# Patient Record
Sex: Female | Born: 1995 | Race: White | Hispanic: No | Marital: Single | Smoking: Never smoker
Health system: Southern US, Community
[De-identification: ages and names within clinical notes are randomized; demographics above are authoritative.]

## PROBLEM LIST (undated history)

## (undated) DIAGNOSIS — E669 Obesity, unspecified: Secondary | ICD-10-CM

## (undated) DIAGNOSIS — F419 Anxiety disorder, unspecified: Secondary | ICD-10-CM

## (undated) DIAGNOSIS — H539 Unspecified visual disturbance: Secondary | ICD-10-CM

## (undated) DIAGNOSIS — R011 Cardiac murmur, unspecified: Secondary | ICD-10-CM

## (undated) DIAGNOSIS — N915 Oligomenorrhea, unspecified: Secondary | ICD-10-CM

## (undated) HISTORY — PX: OTHER SURGICAL HISTORY: SHX169

## (undated) HISTORY — DX: Oligomenorrhea, unspecified: N91.5

---
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
## 2017-02-11 ENCOUNTER — Ambulatory Visit (INDEPENDENT_AMBULATORY_CARE_PROVIDER_SITE_OTHER): Payer: PRIVATE HEALTH INSURANCE | Admitting: Pediatrics

## 2017-03-11 ENCOUNTER — Ambulatory Visit (INDEPENDENT_AMBULATORY_CARE_PROVIDER_SITE_OTHER): Payer: PRIVATE HEALTH INSURANCE | Admitting: Pediatrics

## 2017-03-11 ENCOUNTER — Encounter (INDEPENDENT_AMBULATORY_CARE_PROVIDER_SITE_OTHER): Payer: Self-pay | Admitting: Pediatrics

## 2017-03-11 VITALS — BP 128/76 | HR 68 | Wt 178.2 lb

## 2017-03-11 DIAGNOSIS — E288 Other ovarian dysfunction: Secondary | ICD-10-CM

## 2017-03-11 DIAGNOSIS — N915 Oligomenorrhea, unspecified: Secondary | ICD-10-CM

## 2017-03-11 DIAGNOSIS — L659 Nonscarring hair loss, unspecified: Secondary | ICD-10-CM

## 2017-03-11 DIAGNOSIS — R635 Abnormal weight gain: Secondary | ICD-10-CM

## 2017-03-11 LAB — POCT GLUCOSE (DEVICE FOR HOME USE): POC GLUCOSE: 102 mg/dL — AB (ref 70–99)

## 2017-03-11 LAB — POCT GLYCOSYLATED HEMOGLOBIN (HGB A1C): Hemoglobin A1C: 5

## 2017-03-11 NOTE — Patient Instructions (Addendum)
It was a pleasure to see you in clinic today.   Feel free to contact our office at [phone] with questions or concerns.  -Continue your current medications  -Please let me know if you have concerns

## 2017-03-11 NOTE — Progress Notes (Signed)
Pediatric Endocrinology Consultation Follow-up Visit  [name], [name] [name] 10-14-1995  [name] [name], Leonard, MD  Chief Complaint: oligomenorrhea, thinning hair, hyperandrogenism  HPI: [name] [name]  is a 21 y.o. female presenting for follow-up of oligomenorrhea, hyperandrogenism, and thinning hair.  she a ttended this visit alone.  1. [name] was initially referred [facility] in 09/2015 for oligomenorrhea and thinning hair noted when she was evaluated by Dr. Sharene SkeansHickling for papilledema (neuro work-up of papilledema showed normal MRI/MRV with LP showing normal opening pressure and no abnormalities of CSF.  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  At her initial [facility] visit, she was noted to have thinning hair on her scalp and oligomenorrhea, so PCOS work-up was performed which showed elevated free testosterone, low SHBG, normal TFTs, normal prolactin.  She was started on an OCP (junel  Fe 1.5/30) to reduce androgen levels and increase SHBG to help regulate menses.  2. Since last visit on 09/24/16, [name] has been well.  She started college and is living on campus.    She continues to take junel OCP well without problems. No spotting.  She continues having bleeding only during the week of inactive pills.  She still gets acne on her chin, usually during the week of inactive pills or the first week of the new pack.  No hirsuitism.  She is self-conscious that the hair on her head appears very thin though she feels overall it has continued to thicken.    [name] gained 5lb since last visit.  She is eating more frequently at school.  She drinks coffee, water, and gatorade.  Activity includes walking on campus only; no organized exercise.  Her A1c has improved slightly to 50% today.  2. ROS: Greater than 10 systems reviewed with pertinent positives listed in HPI, otherwise neg.  Constitutional: weight as above.  No headaches, no changes in vision.  No neurology  follow-up required.  Genitourinary: Periods as above Psychiatric: Normal affect    Past Medical History:  Past Medical History:  Diagnosis Date  . Anxiety   . Heart murmur    as a baby,   . Obesity   . Oligomenorrhea    In 09/2015 had elevated free testosterone and low SHBG.  Started on Junel 1.5/30 OCPs.  . Vision abnormalities    wears contacts   Pregnancy complicated by diet controlled gestational diabetes and cholelithiasis, delivered at term, birth weight 6lb 7oz, birth length 19 inches, no NICU required  Meds: Junel Fe 1.5/30  Allergies: No Known Allergies  Surgical History: Past Surgical History:  Procedure Laterality Date  . None      Family History:  Family History  Problem Relation Age of Onset  . COPD Maternal Grandfather   . Asthma Maternal Grandfather   . Diabetes Maternal Grandfather   . Hypertension Maternal Grandfather   . Diabetes Maternal Grandmother   . Hypertension Maternal Grandmother   . Heart disease Other        valve issuses  . Hepatitis C Father   . Diabetes Maternal Aunt   No family hx of PCOS diagnosis though maternal aunt has thinning hair, facial hair, and diabetes with menstrual irregularities No family history of blood clot or stroke at an early age  Social History: [place] [name] [place]. College not too stressful this semester.  She is not working while attending college. She does not smoke.  Physical Exam:  Vitals:   03/11/17  0929  BP: 128/76  Pulse: 68  Weight: 178 lb 3.2 oz (80.8 kg)   BP 128/76   Pulse 68   Wt 178 lb 3.2 oz (80.8 kg)   BMI 32.30 kg/m  Body mass index: body mass index is 32.3 kg/m. No height on file for this encounter.   Wt Readings from Last 3 Encounters:  03/11/17 178 lb 3.2 oz (80.8 kg) (95 %, Z= 1.61)*  09/24/16 173 lb 9.6 oz (78.7 kg) (94 %, Z= 1.55)*  05/26/16 180 lb 9.6 oz (81.9 kg) (95 %, Z= 1.69)*   * Growth percentiles are based on CDC 2-20 Years data.   Ht  Readings from Last 3 Encounters:  09/24/16 5' 2.28" (1.582 m) (22 %, Z= -0.77)*  01/24/16 5' 2.32" (1.583 m) (23 %, Z= -0.74)*  10/18/15 5' 2.68" (1.592 m) (28 %, Z= -0.59)*   * Growth percentiles are based on CDC 2-20 Years data.   Body mass index is 32.3 kg/m.  95 %ile (Z= 1.61) based on CDC 2-20 Years weight-for-age data using vitals from 03/11/2017. No height on file for this encounter.  General: Well developed, female in no acute distress.  Appears stated age, answers questions appropriately Head: Normocephalic, atraumatic.  Hair appears full on posterior/lateral scalp though small amount of scalp visible on right just above anterior hairline Eyes:  Pupils equal and round. EOMI.   Sclera white.  No eye drainage.   Ears/Nose/Mouth/Throat: Nares patent, no nasal drainage.  Normal dentition, mucous membranes moist.  Oropharynx intact.  No significant hair growth on face or abdomen.  Few erythematous papules on chin Neck: supple, no cervical lymphadenopathy, no thyromegaly. No acanthosis nigricans.  Cardiovascular: regular rate, normal S1/S2, no murmurs Respiratory: No increased work of breathing.  Lungs clear to auscultation bilaterally.  No wheezes. Abdomen: soft, nontender, nondistended. No appreciable masses.  No dark hairs on abdomen.  Extremities: warm, well perfused, cap refill < 2 sec.   Musculoskeletal: Normal muscle mass.  Normal strength Skin: warm, dry.  No rash. Acne as above Neurologic: alert and oriented, normal speech, quieter than in the past   Laboratory Evaluation: Results for orders placed or performed in visit on 03/11/17  POCT Glucose (Device for Home Use)  Result Value Ref Range   Glucose Fasting, POC  70 - 99 mg/dL   POC Glucose 409 (A) 70 - 99 mg/dl  POCT HgB W1X  Result Value Ref Range   Hemoglobin A1C 5.0    A1c trend: 5.5% in 09/2015-->5.4% 04/2016-->5.1% 08/2016-->5.0% 02/2017  Assessment/Plan: [name] is a 21 y.o. female with history of  oligomenorrhea, thinning hair, and hyperandrogenism who has had improvement in menstrual regularity and clinical improvement in hyperandrogenism since starting OCPs.  She did have weight gain since last visit, likely due to life changes (starting college, changing living situation).  A1c has improved and remains in the normal range.    1. Oligomenorrhea, unspecified type -Continue current OCPs -Advised to contact me if she develops spotting or other menstruation concerns -Advised not to smoke  2. Thinning hair -I expect continued improvement as androgen levels improve from OCP treatment  3. Hyperandrogenism -Clinically improving.  Will continue to monitor at future visits  4. Weight gain -A1c and POC glucose as above -Encouraged increased activity as able -Change to G2 instead of regular gatorade -Will continue to monitor closely at future visits  Follow-up:   Return in about 5 months (around 08/11/2017). 4-6 month follow-up   Casimiro Needle, MD

## 2017-08-12 ENCOUNTER — Ambulatory Visit (INDEPENDENT_AMBULATORY_CARE_PROVIDER_SITE_OTHER): Payer: PRIVATE HEALTH INSURANCE | Admitting: Pediatrics

## 2017-08-23 IMAGING — MR MR [PERSON_NAME] HEAD
11 series · 46 of 48 positions shown · non-contrast
Comparison: None.

CLINICAL DATA: Papilledema. Obesity. Possible idiopathic
intracranial hypertension or venous sinus thrombosis.

EXAM:
MRI HEAD WITHOUT CONTRAST
MRV HEAD WITHOUT CONTRAST
TECHNIQUE: Multiplanar, multiecho pulse sequences of the brain and surrounding
structures were obtained without intravenous contrast. Angiographic
images of the intracranial venous structures were obtained using MRV
technique without intravenous contrast.

[Series 2: T1 · sagittal · 5.0mm · 0.45mm/px · 1 of 21 slices shown]
[im 1/21]
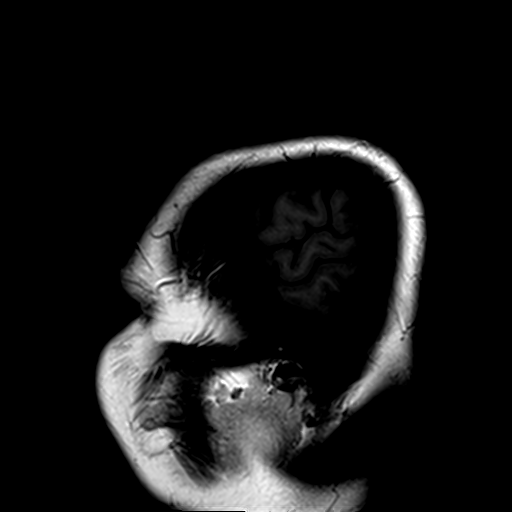

[Series 3: DWI · axial · 3.0mm · 1.80mm/px · z∈[-42,+104]mm · 8 of 99 slices shown (1 of 4)]
[im 1/99]
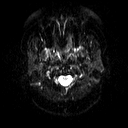
[im 15/99]
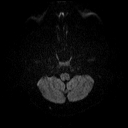
[im 29/99]
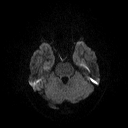
[im 43/99]
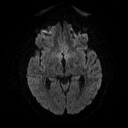
[im 57/99]
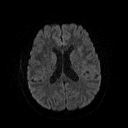
[im 71/99]
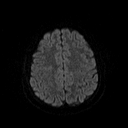
[im 85/99]
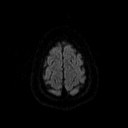
[im 99/99]
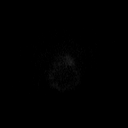

[Series 4: DWI · axial · 3.0mm · 1.80mm/px · z∈[-42,+104]mm · 4 of 49 slices shown (2 of 4)]
[im 1/49]
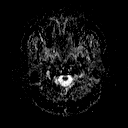
[im 17/49]
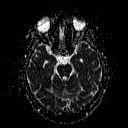
[im 33/49]
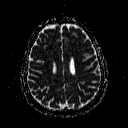
[im 49/49]
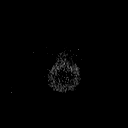

[Series 6: swi_images · axial · 2.0mm · 0.90mm/px · z∈[-48,+110]mm · 6 of 80 slices shown]
[im 1/80]
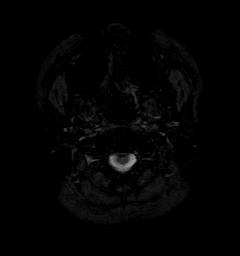
[im 16/80]
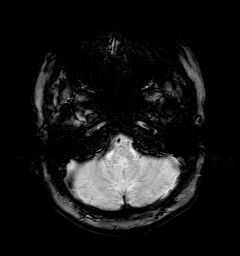
[im 32/80]
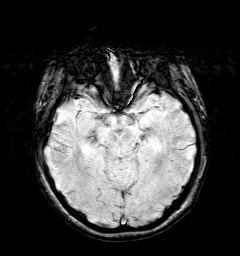
[im 48/80]
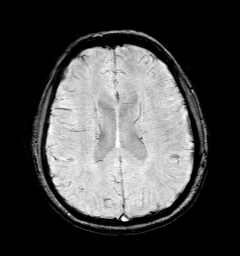
[im 64/80]
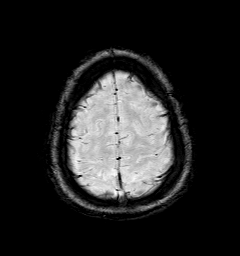
[im 80/80]
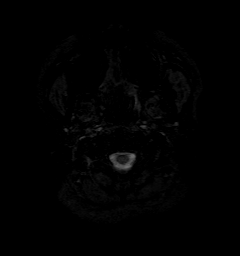

[Series 7: DWI · coronal · 5.0mm · 1.80mm/px · 6 of 73 slices shown (3 of 4)]
[im 1/73]
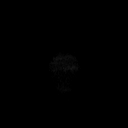
[im 15/73]
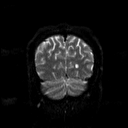
[im 29/73]
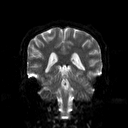
[im 44/73]
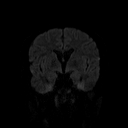
[im 58/73]
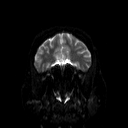
[im 73/73]
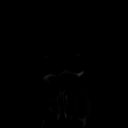

[Series 8: DWI · coronal · 5.0mm · 1.80mm/px · 3 of 38 slices shown (4 of 4)]
[im 1/38]
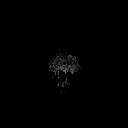
[im 19/38]
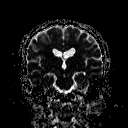
[im 38/38]
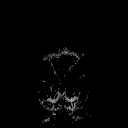

[Series 9: T2 · axial · 5.0mm · 0.51mm/px · z∈[-38,+103]mm · 2 of 22 slices shown (1 of 2)]
[im 1/22]
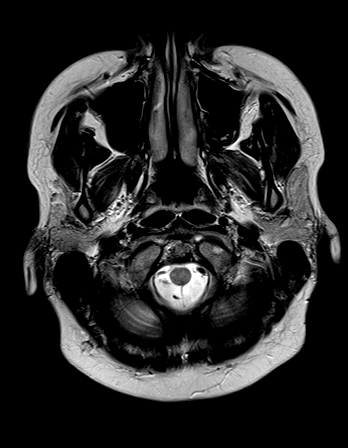
[im 22/22]
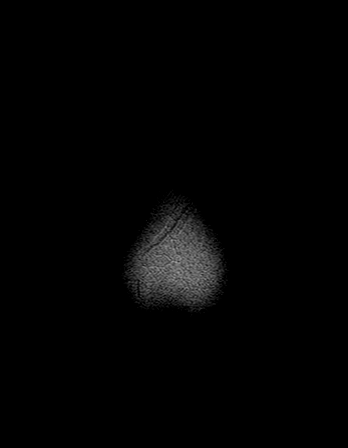

[Series 10: FLAIR · axial · 5.0mm · 0.45mm/px · z∈[-38,+103]mm · 2 of 22 slices shown]
[im 1/22]
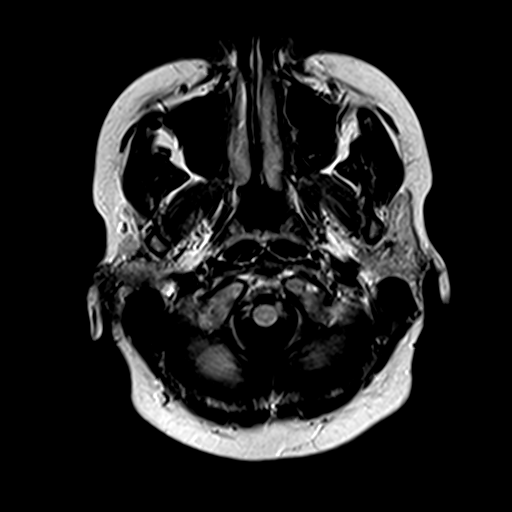
[im 22/22]
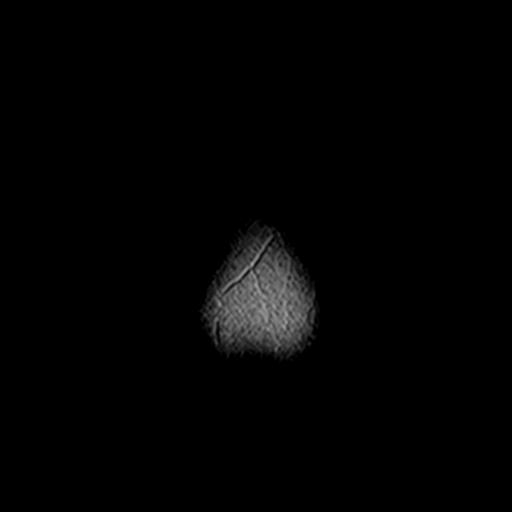

[Series 11: t1_mpr_tra · axial · 2.0mm · 0.45mm/px · z∈[-46,+111]mm · 6 of 80 slices shown]
[im 1/80]
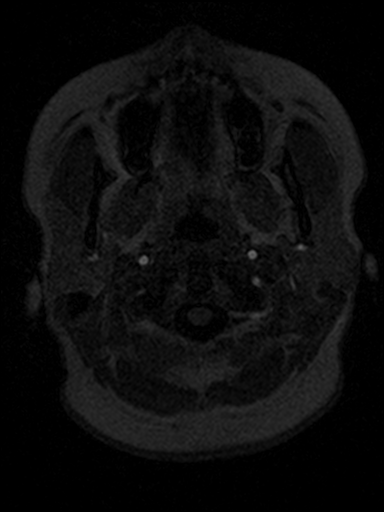
[im 16/80]
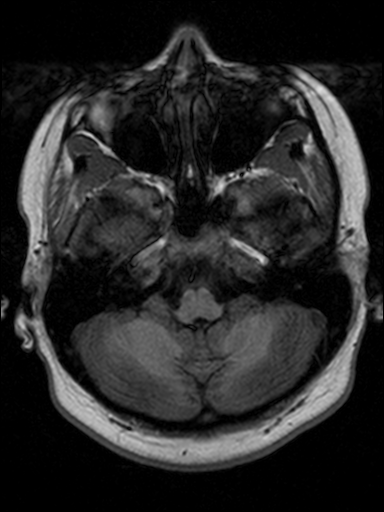
[im 32/80]
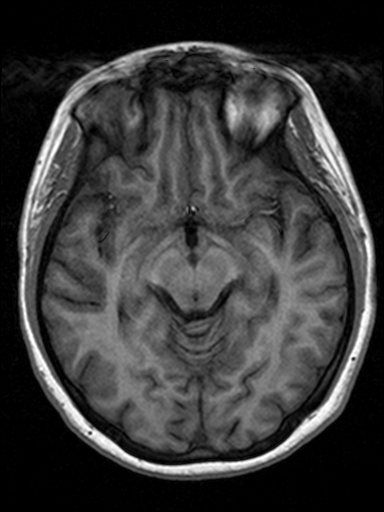
[im 48/80]
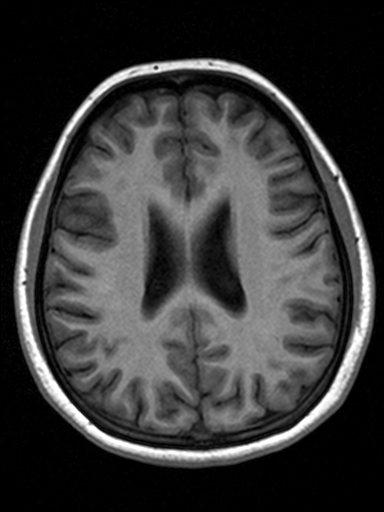
[im 64/80]
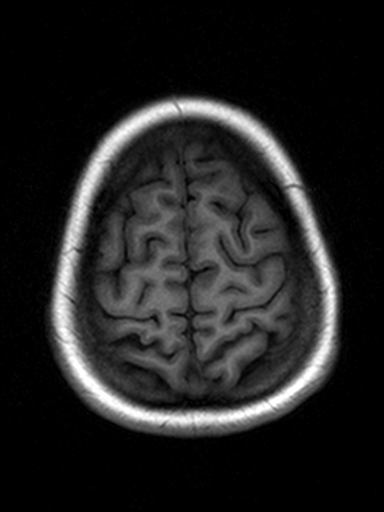
[im 80/80]
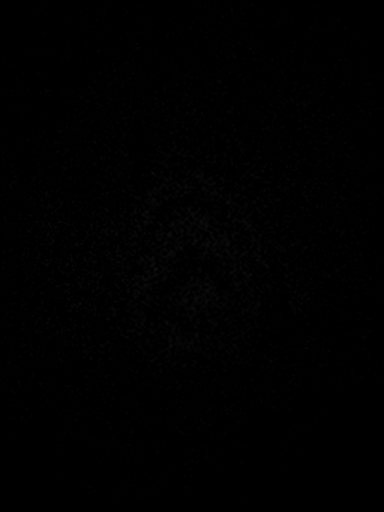

[Series 12: T2 · coronal · 5.0mm · 0.45mm/px · 2 of 28 slices shown (2 of 2)]
[im 1/28]
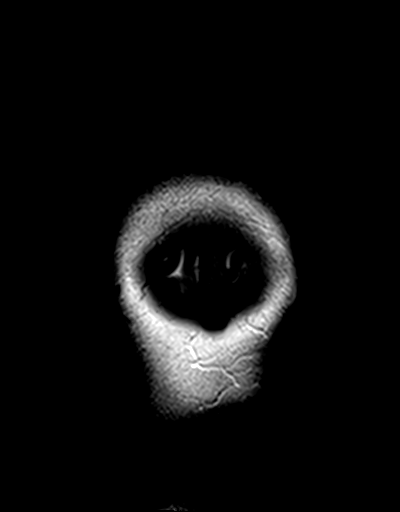
[im 28/28]
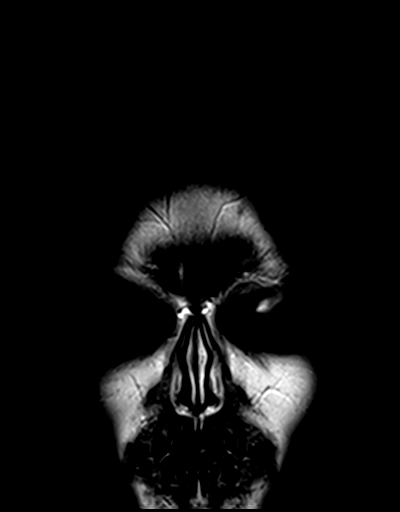

[Series 13: (id) coronal · coronal · 3.0mm · 0.49mm/px · 6 of 98 slices shown]
[im 1/98]
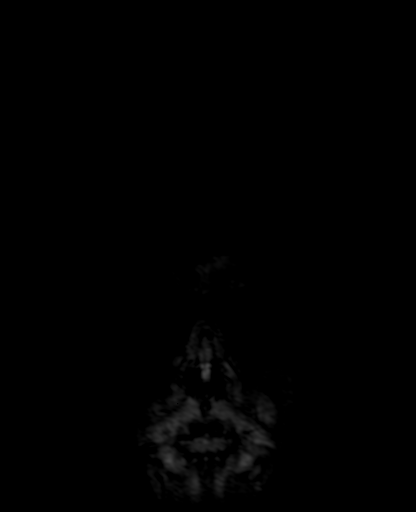
[im 14/98]
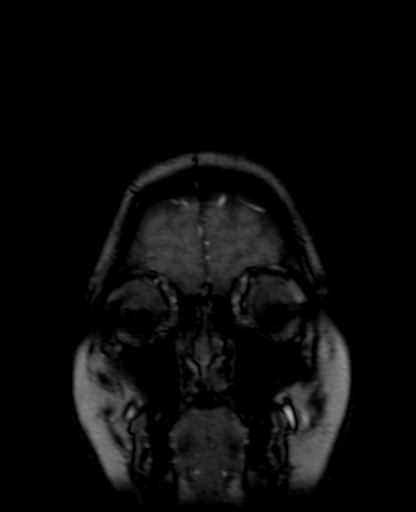
[im 28/98]
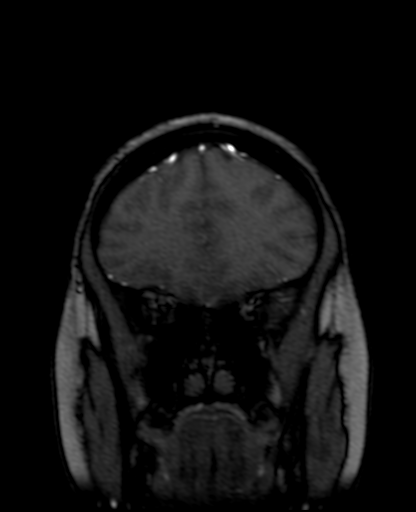
[im 42/98]
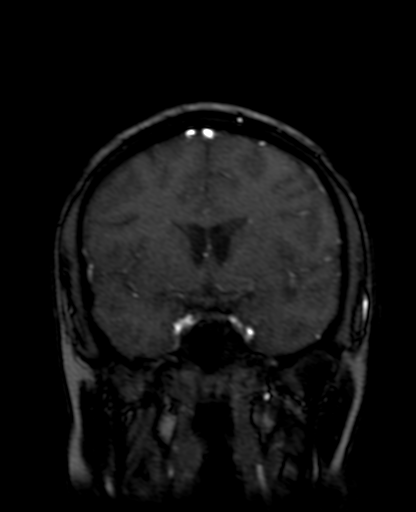
[im 56/98]
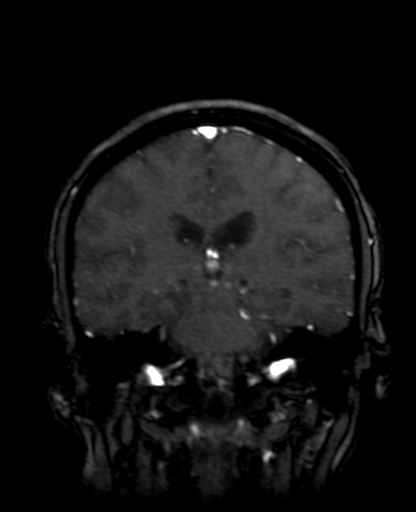
[im 70/98]
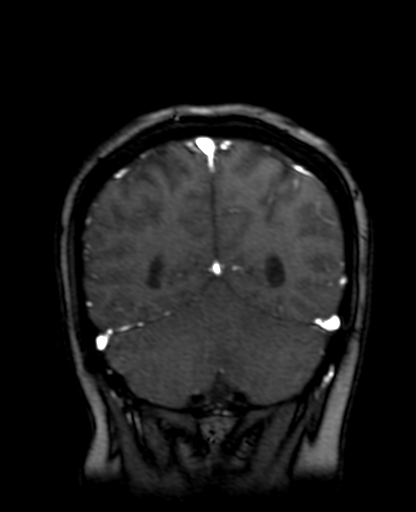

[46 of 48 positions shown; findings below may reference images not displayed]

FINDINGS: There is no evidence of acute infarct, intracranial hemorrhage,
mass, midline shift, or extra-axial fluid collection. Ventricles and
sulci are normal. The brain is normal in signal.

Orbits are grossly unremarkable on this nondedicated study. No
significant inflammatory changes are seen in the paranasal sinuses
or mastoid air cells. Major intracranial vascular flow voids are
preserved.

Superior sagittal sinus, internal cerebral veins, vein of Kwiatkowski,
straight sinus, transverse sinuses, sigmoid sinuses, and jugular
bulbs are patent. There is a 1 cm focus of decreased signal in the
medial right transverse sinus compatible with stenosis. The left
transverse sinus is widely patent without stenosis.
IMPRESSION: 1. Unremarkable brain MRI.
2. No evidence of venous sinus thrombosis.
3. Focal stenosis in the medial right transverse sinus.

## 2017-11-10 ENCOUNTER — Other Ambulatory Visit (INDEPENDENT_AMBULATORY_CARE_PROVIDER_SITE_OTHER): Payer: Self-pay | Admitting: Pediatrics

## 2017-11-10 DIAGNOSIS — N915 Oligomenorrhea, unspecified: Secondary | ICD-10-CM

## 2017-12-09 ENCOUNTER — Ambulatory Visit (INDEPENDENT_AMBULATORY_CARE_PROVIDER_SITE_OTHER): Payer: PRIVATE HEALTH INSURANCE | Admitting: Pediatrics

## 2017-12-09 ENCOUNTER — Encounter (INDEPENDENT_AMBULATORY_CARE_PROVIDER_SITE_OTHER): Payer: Self-pay | Admitting: Pediatrics

## 2017-12-09 VITALS — BP 116/60 | HR 68 | Ht 62.01 in | Wt 179.8 lb

## 2017-12-09 DIAGNOSIS — N915 Oligomenorrhea, unspecified: Secondary | ICD-10-CM | POA: Diagnosis not present

## 2017-12-09 DIAGNOSIS — L659 Nonscarring hair loss, unspecified: Secondary | ICD-10-CM | POA: Diagnosis not present

## 2017-12-09 DIAGNOSIS — E288 Other ovarian dysfunction: Secondary | ICD-10-CM | POA: Diagnosis not present

## 2017-12-09 LAB — POCT GLUCOSE (DEVICE FOR HOME USE): POC Glucose: 77 mg/dl (ref 70–99)

## 2017-12-09 LAB — POCT GLYCOSYLATED HEMOGLOBIN (HGB A1C): HEMOGLOBIN A1C: 4.8 % (ref 4.0–5.6)

## 2017-12-09 NOTE — Patient Instructions (Addendum)
It was a pleasure to see you in clinic today.   Feel free to contact our office at [phone] with questions or concerns.  Don't smoke!  If you have swelling or pain in 1 leg without other symptoms, please be evaluated for a blood clot

## 2017-12-10 DIAGNOSIS — N915 Oligomenorrhea, unspecified: Secondary | ICD-10-CM | POA: Insufficient documentation

## 2017-12-10 DIAGNOSIS — E288 Other ovarian dysfunction: Secondary | ICD-10-CM | POA: Insufficient documentation

## 2017-12-10 NOTE — Progress Notes (Signed)
Pediatric Endocrinology Consultation Follow-up Visit  [name], [name] [name] 12/30/1995  [name], MD  Chief Complaint: oligomenorrhea, thinning hair, hyperandrogenism  HPI: [name]  is a 22 y.o. female presenting for follow-up of oligomenorrhea, hyperandrogenism, and thinning hair.  she attended this visit alone.  1. [place] was initially referred to [name] in 09/2015 for oligomenorrhea and thinning hair noted when she was evaluated by Dr. Sharene Skeans for papilledema (neuro work-up of papilledema showed normal MRI/MRV with LP showing normal opening pressure and no abnormalities of CSF.  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  At her initial [name] visit, she was noted to have thinning hair on her scalp and oligomenorrhea, so PCOS work-up was performed which showed elevated free testosterone, low SHBG, normal TFTs, normal prolactin.  She was started on an OCP (junel  Fe 1.5/30) to reduce androgen levels and increase SHBG to help regulate menses.  2. Since last visit on 03/11/17, [name] has been well.  No recent problems.   She continues on Junel 1.5/30.  Having withdrawal bleed as expected during the week of inactive pills.  Periods lasting 4-5 days.  No significant cramps. No spotting.  No acne.  No hirsutism.  Hair on her head is getting slightly thicker; she has been taking biotin to help with this.   Her A1c is normal today at 4.8%.  Weight is up 1lb since last visit.   ROS: Greater than 10 systems reviewed with pertinent positives listed in HPI, otherwise neg.  Constitutional: weight as above.  No blurry vision. Genitourinary: Periods as above Psychiatric: Normal affect   Past Medical History:  Past Medical History:  Diagnosis Date  . Anxiety   . Heart murmur    as a baby,   . Obesity   . Oligomenorrhea    In 09/2015 had elevated free testosterone and low SHBG.  Started on Junel 1.5/30 OCPs.  . Vision abnormalities    wears contacts   Pregnancy complicated by diet controlled gestational diabetes and cholelithiasis, delivered at term, birth weight 6lb 7oz, birth length 19 inches, no NICU required  Meds: Junel Fe 1.5/30 Biotin  Allergies: No Known Allergies  Surgical History: Past Surgical History:  Procedure Laterality Date  . None      Family History:  Family History  Problem Relation Age of Onset  . COPD Maternal Grandfather   . Asthma Maternal Grandfather   . Diabetes Maternal Grandfather   . Hypertension Maternal Grandfather   . Diabetes Maternal Grandmother   . Hypertension Maternal Grandmother   . Heart disease Other        valve issuses  . Hepatitis C Father   . Diabetes Maternal Aunt   No family hx of PCOS diagnosis though maternal aunt has thinning hair, facial hair, and diabetes with menstrual irregularities No family history of blood clot or stroke at an early age  Social History: Does not smoke. Completed freshman year at [name] and is taking summer classes.  Living at home for the summer.  Working at [employer].  Physical Exam:  Vitals:   12/09/17 1357  BP: 116/60  Pulse: 68  Weight: 179 lb 12.8 oz (81.6 kg)  Height: 5' 2.01" (1.575 m)   BP 116/60   Pulse 68   Ht 5' 2.01" (1.575 m)   Wt 179 lb 12.8 oz (81.6 kg)   LMP 11/15/2017 (Within Weeks)   BMI 32.88 kg/m  Body mass index: body mass index is 32.88  kg/m. Blood pressure percentiles are not available for patients who are 18 years or older.   Wt Readings from Last 3 Encounters:  12/09/17 179 lb 12.8 oz (81.6 kg) (95 %, Z= 1.60)*  03/11/17 178 lb 3.2 oz (80.8 kg) (95 %, Z= 1.61)*  09/24/16 173 lb 9.6 oz (78.7 kg) (94 %, Z= 1.55)*   * Growth percentiles are based on CDC (Girls, 2-20 Years) data.   Ht Readings from Last 3 Encounters:  12/09/17 5' 2.01" (1.575 m) (19 %, Z= -0.89)*  09/24/16 5' 2.28" (1.582 m) (22 %, Z= -0.77)*  01/24/16 5' 2.32" (1.583 m) (23 %, Z= -0.74)*   * Growth percentiles are based on  CDC (Girls, 2-20 Years) data.   Body mass index is 32.88 kg/m.  95 %ile (Z= 1.60) based on CDC (Girls, 2-20 Years) weight-for-age data using vitals from 12/09/2017. 19 %ile (Z= -0.89) based on CDC (Girls, 2-20 Years) Stature-for-age data based on Stature recorded on 12/09/2017.  General: Well developed, well nourished female in no acute distress.  Appears stated age Head: Normocephalic, atraumatic.  Hair thin with scalp barely visible Eyes:  Pupils equal and round. EOMI.   Sclera white.  No eye drainage.   Ears/Nose/Mouth/Throat: Nares patent, no nasal drainage.  Normal dentition, mucous membranes moist.   Neck: supple, no cervical lymphadenopathy, no thyromegaly Cardiovascular: regular rate, normal S1/S2, no murmurs Respiratory: No increased work of breathing.  Lungs clear to auscultation bilaterally.  No wheezes. Abdomen: soft, nontender, nondistended. No appreciable masses  Extremities: warm, well perfused, cap refill < 2 sec.   Musculoskeletal: Normal muscle mass.  Normal strength Skin: warm, dry.  No rash or lesions. No acne or facial hair. Neurologic: alert and oriented, normal speech, no tremor  Laboratory Evaluation: Results for orders placed or performed in visit on 12/09/17  POCT Glucose (Device for Home Use)  Result Value Ref Range   Glucose Fasting, POC  70 - 99 mg/dL   POC Glucose 77 70 - 99 mg/dl  POCT HgB [id number]  Result Value Ref Range   Hemoglobin A1C 4.8 4.0 - 5.6 %   HbA1c, POC (prediabetic range)  5.7 - 6.4 %   HbA1c, POC (controlled diabetic range)  0.0 - 7.0 %   A1c trend: 5.5% in 09/2015-->5.4% 04/2016-->5.1% 08/2016-->5.0% 02/2017-->4.8% 11/2017  Assessment/Plan: [name] is a 22 y.o. female with history of oligomenorrhea, thinning hair, and hyperandrogenism who is doing well on OCPs with improvement in menstrual regularity and clinical improvement in hyperandrogenism.  Weight is stable and A1c is well within the normal range.   1. Oligomenorrhea,  unspecified type -Continue current OCPs. -Advised against smoking  -Advised to seek care right away if concerns for blood clot -POC A1c and glucose normal  2. Thinning hair -Continues to improve.  Will continue to monitor  3. Hyperandrogenism -Clinically improving.  Will continue to monitor.    Follow-up:   Return in about 5 months (around 05/11/2018). 4-6 month follow-up   Casimiro NeedleAshley Bashioum Leeroy Lovings, MD

## 2018-01-30 ENCOUNTER — Other Ambulatory Visit (INDEPENDENT_AMBULATORY_CARE_PROVIDER_SITE_OTHER): Payer: Self-pay | Admitting: Pediatrics

## 2018-01-30 DIAGNOSIS — N915 Oligomenorrhea, unspecified: Secondary | ICD-10-CM

## 2018-04-14 ENCOUNTER — Encounter (INDEPENDENT_AMBULATORY_CARE_PROVIDER_SITE_OTHER): Payer: Self-pay | Admitting: Pediatrics

## 2018-04-14 ENCOUNTER — Ambulatory Visit (INDEPENDENT_AMBULATORY_CARE_PROVIDER_SITE_OTHER): Payer: PRIVATE HEALTH INSURANCE | Admitting: Pediatrics

## 2018-04-14 VITALS — BP 122/74 | HR 76 | Ht 61.97 in | Wt 188.6 lb

## 2018-04-14 DIAGNOSIS — N915 Oligomenorrhea, unspecified: Secondary | ICD-10-CM | POA: Diagnosis not present

## 2018-04-14 DIAGNOSIS — E288 Other ovarian dysfunction: Secondary | ICD-10-CM

## 2018-04-14 DIAGNOSIS — L659 Nonscarring hair loss, unspecified: Secondary | ICD-10-CM | POA: Diagnosis not present

## 2018-04-14 MED ORDER — NORETHIN ACE-ETH ESTRAD-FE 1.5-30 MG-MCG PO TABS: 1.0000 | ORAL_TABLET | Freq: Every day | ORAL | 3 refills | Status: DC

## 2018-04-14 NOTE — Patient Instructions (Signed)
It was a pleasure to see you in clinic today.   Feel free to contact our office during normal business hours at [phone] with questions or concerns. If you need us urgently after normal business hours, please call the above number to reach our answering service who will contact the on-call pediatric endocrinologist.   

## 2018-04-14 NOTE — Progress Notes (Signed)
Pediatric Endocrinology Consultation Follow-up Visit  [name] [name], [name] [name] 1995-10-16  [name], MD  Chief Complaint: oligomenorrhea, thinning hair, hyperandrogenism  HPI: [name] [name]  is a 22 y.o. female presenting for follow-up of oligomenorrhea, hyperandrogenism, and thinning hair.  she attended this visit alone.  1. [place] was initially referred to [facility] in 09/2015 for oligomenorrhea and thinning hair noted when she was evaluated by Dr. Sharene Skeans for papilledema (neuro work-up of papilledema showed normal MRI/MRV with LP showing normal opening pressure and no abnormalities of CSF.  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  At her initial [facility] visit, she was noted to have thinning hair on her scalp and oligomenorrhea, so PCOS work-up was performed which showed elevated free testosterone, low SHBG, normal TFTs, normal prolactin.  She was started on an OCP (junel  Fe 1.5/30) to reduce androgen levels and increase SHBG to help regulate menses.  2. Since last visit on 12/09/17,  has been well.  No concerns.  Continues on Junel Fe 1.5/30 Withdrawal bleeding during week of inactive pills: Yes.  Lasts 4-5 days Spotting: No Severe cramping: No Acne: None Hair growth: None Smoking: No  No changes in scalp hair recently. Stopped taking biotin to help with hair growth.  Trying to get activity daily through walking around campus.  Eating well. Weight increased 9lb since last visit.  She is unsure why she has had weight gain.    ROS:  All systems reviewed with pertinent positives listed below; otherwise negative. Constitutional: Weight as above.  Sleeping well. Headaches more often when taking inactive pills, though not intolerable HEENT: No blurry vision.   Respiratory: No increased work of breathing currently GI: No constipation or diarrhea GU: Periods as above Musculoskeletal: No joint deformity Neuro: Normal  affect Endocrine: As above  Past Medical History:  Past Medical History:  Diagnosis Date  . Anxiety   . Heart murmur    as a baby,   . Obesity   . Oligomenorrhea    In 09/2015 had elevated free testosterone and low SHBG.  Started on Junel 1.5/30 OCPs.  . Vision abnormalities    wears contacts   Pregnancy complicated by diet controlled gestational diabetes and cholelithiasis, delivered at term, birth weight 6lb 7oz, birth length 19 inches, no NICU required  Meds: Junel Fe 1.5/30  Allergies: No Known Allergies  Surgical History: Past Surgical History:  Procedure Laterality Date  . None      Family History:  Family History  Problem Relation Age of Onset  . COPD Maternal Grandfather   . Asthma Maternal Grandfather   . Diabetes Maternal Grandfather   . Hypertension Maternal Grandfather   . Diabetes Maternal Grandmother   . Hypertension Maternal Grandmother   . Heart disease Other        valve issuses  . Hepatitis C Father   . Diabetes Maternal Aunt   No family hx of PCOS diagnosis though maternal aunt has thinning hair, facial hair, and diabetes with menstrual irregularities No family history of blood clot or stroke at an early age  Social History: Sophomore year at [employer]; getting along well with 3 roommates. Working at [employer] on Sundays.  Physical Exam:  Vitals:   04/14/18 1327  BP: 122/74  Pulse: 76  Weight: 188 lb 9.6 oz (85.5 kg)  Height: 5' 1.97" (1.574 m)   BP 122/74   Pulse 76   Ht 5' 1.97" (1.574 m)   Wt 188 lb  9.6 oz (85.5 kg)   BMI 34.53 kg/m  Body mass index: body mass index is 34.53 kg/m. Blood pressure percentiles are not available for patients who are 18 years or older.   Wt Readings from Last 3 Encounters:  04/14/18 188 lb 9.6 oz (85.5 kg) (96 %, Z= 1.75)*  12/09/17 179 lb 12.8 oz (81.6 kg) (95 %, Z= 1.60)*  03/11/17 178 lb 3.2 oz (80.8 kg) (95 %, Z= 1.61)*   * Growth percentiles are based on CDC (Girls, 2-20 Years) data.   Ht Readings  from Last 3 Encounters:  04/14/18 5' 1.97" (1.574 m) (18 %, Z= -0.91)*  12/09/17 5' 2.01" (1.575 m) (19 %, Z= -0.89)*  09/24/16 5' 2.28" (1.582 m) (22 %, Z= -0.77)*   * Growth percentiles are based on CDC (Girls, 2-20 Years) data.   Body mass index is 34.53 kg/m.  96 %ile (Z= 1.75) based on CDC (Girls, 2-20 Years) weight-for-age data using vitals from 04/14/2018. 18 %ile (Z= -0.91) based on CDC (Girls, 2-20 Years) Stature-for-age data based on Stature recorded on 04/14/2018.  General: Well developed, well nourished female in no acute distress.  Appears stated age Head: Normocephalic, atraumatic.  Hair on crown remains thin with scalp barely visible. Eyes:  Pupils equal and round. EOMI.   Sclera white.  No eye drainage.   Ears/Nose/Mouth/Throat: Nares patent, no nasal drainage.  Normal dentition, mucous membranes moist.  No facial acne.  No facial hair. Neck: supple, no cervical lymphadenopathy, no thyromegaly Cardiovascular: regular rate, normal S1/S2, no murmurs Respiratory: No increased work of breathing.  Lungs clear to auscultation bilaterally.  No wheezes. Abdomen: soft, nontender, nondistended. Extremities: warm, well perfused, cap refill < 2 sec.   Musculoskeletal: Normal muscle mass.  Normal strength Skin: warm, dry.  No rash or lesions. Neurologic: alert and oriented, normal speech, no tremor  Laboratory Evaluation: Results for orders placed or performed in visit on 12/09/17  POCT Glucose (Device for Home Use)  Result Value Ref Range   Glucose Fasting, POC  70 - 99 mg/dL   POC Glucose 77 70 - 99 mg/dl  POCT HgB N8G  Result Value Ref Range   Hemoglobin A1C 4.8 4.0 - 5.6 %   HbA1c, POC (prediabetic range)  5.7 - 6.4 %   HbA1c, POC (controlled diabetic range)  0.0 - 7.0 %   A1c trend: 5.5% in 09/2015-->5.4% 04/2016-->5.1% 08/2016-->5.0% 02/2017-->4.8% 11/2017  Assessment/Plan: [name] [name] is a 22 y.o. female with history of oligomenorrhea, thinning hair, and  hyperandrogenism who continues to do well on combination OCPs.  Having normal withdrawal bleeding, no spotting, no obvious clinical signs of hyperandrogenism (no acne or hirsuitism).  Scalp hair remains thin though overall has improved.  Most recent A1c 4 months ago was normal.  She has had weight gain since last visit with weight tracking just above 95th% (was >97th% in the past).  She would benefit from increased physical activity.    1. Oligomenorrhea, unspecified type/ 2. Hyperandrogenism/ 3. Thinning hair -Continue current OCP.  New Rx for 3 month supply with 3 refills sent to pharmacy. -Discussed increased risk of blood clot while on combination OCPs and advised against smoking.  Encouraged to seek care immediately if she develops pain or swelling in 1 leg or sudden onset shortness of breath as these may be a sign of blood clot.  -Growth chart reviewed with patient; recommended increased physical activity since she has had some weight gain since last visit.  Follow-up:   Return in  about 6 months (around 10/14/2018).   Casimiro Needle, MD

## 2018-10-19 ENCOUNTER — Ambulatory Visit (INDEPENDENT_AMBULATORY_CARE_PROVIDER_SITE_OTHER): Payer: No Typology Code available for payment source | Admitting: Pediatrics

## 2018-10-19 ENCOUNTER — Other Ambulatory Visit: Payer: Self-pay

## 2018-10-19 ENCOUNTER — Encounter (INDEPENDENT_AMBULATORY_CARE_PROVIDER_SITE_OTHER): Payer: Self-pay | Admitting: Pediatrics

## 2018-10-19 DIAGNOSIS — N915 Oligomenorrhea, unspecified: Secondary | ICD-10-CM

## 2018-10-19 DIAGNOSIS — L659 Nonscarring hair loss, unspecified: Secondary | ICD-10-CM | POA: Diagnosis not present

## 2018-10-19 DIAGNOSIS — E288 Other ovarian dysfunction: Secondary | ICD-10-CM

## 2018-10-19 NOTE — Progress Notes (Signed)
This is a Pediatric Specialist E-Visit follow up consult provided via  [employer] [place] consented to an E-Visit consult today.  Location of patient: [name] [name] is at home  Location of provider: [name] ,MD is at home office  Patient was referred by [name], MD   The following participants were involved in this E-Visit:Jaime Mansfield, RMA [name], MD Lisabeth Register -patient  Chief Complain/ Reason for E-Visit today: Oligomennorhea follow up  Total time on call: 10 minutes Follow up: 3 months  Pediatric Endocrinology Consultation Follow-up Visit  [name], [name] 1995-08-03  [name], MD  Chief Complaint: oligomenorrhea, thinning hair, hyperandrogenism  HPI: [name] [name]  is a 23 y.o. female presenting for follow-up of oligomenorrhea, hyperandrogenism, and thinning hair.  she attended this visit alone.  THIS IS A TELEHEALTH VIDEO VISIT.   1. [place] was initially referred to [facility] in 09/2015 for oligomenorrhea and thinning hair noted when she was evaluated by Dr. Sharene Skeans for papilledema (neuro work-up of papilledema showed normal MRI/MRV with LP showing normal opening pressure and no abnormalities of CSF.  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  At her initial [facility] visit, she was noted to have thinning hair on her scalp and oligomenorrhea, so PCOS work-up was performed which showed elevated free testosterone, low SHBG, normal TFTs, normal prolactin.  She was started on an OCP (junel  Fe 1.5/30) to reduce androgen levels and increase SHBG to help regulate menses.  2. Since last visit on 04/14/18, [name] [name] has been well.    She did forget to take her pill once and started her period 1 week early.  Otherwise no problems.   Continues on Junel Fe 1.5/30 Withdrawal bleeding during week of inactive pills: Yes Spotting: No, except for above instance Severe cramping: No Acne: None Hair growth:  None Smoking: No  No changes in scalp hair fullness  ROS:  All systems reviewed with pertinent positives listed below; otherwise negative. Constitutional: Sleeping well, energy-wise she feels "kind of tired" all the time HEENT: wearing glasses, no vision changes Respiratory: No increased work of breathing currently GI: No constipation or diarrhea GU: periods as above Neuro: Normal affect Endocrine: As above   Past Medical History:  Past Medical History:  Diagnosis Date  . Anxiety   . Heart murmur    as a baby,   . Obesity   . Oligomenorrhea    In 09/2015 had elevated free testosterone and low SHBG.  Started on Junel 1.5/30 OCPs.  . Vision abnormalities    wears contacts   Pregnancy complicated by diet controlled gestational diabetes and cholelithiasis, delivered at term, birth weight 6lb 7oz, birth length 19 inches, no NICU required  Meds: Junel Fe 1.5/30  Allergies: No Known Allergies  Surgical History: Past Surgical History:  Procedure Laterality Date  . None      Family History:  Family History  Problem Relation Age of Onset  . COPD Maternal Grandfather   . Asthma Maternal Grandfather   . Diabetes Maternal Grandfather   . Hypertension Maternal Grandfather   . Diabetes Maternal Grandmother   . Hypertension Maternal Grandmother   . Heart disease Other        valve issuses  . Hepatitis C Father   . 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 Taking a math class online over the summer.  Working at [employer] for 4 hour shifts intermittently  Physical Exam:  There were no vitals filed for this visit. LMP 09/27/2018 (Approximate)  Body mass index: body mass index is unknown because there is no height or weight on file. Growth percentile SmartLinks can only  be used for patients less than 21 years old.   Wt Readings from Last 3 Encounters:  04/14/18 188 lb 9.6 oz (85.5 kg) (96 %, Z= 1.75)*  12/09/17 179 lb 12.8 oz (81.6 kg) (95 %, Z= 1.60)*  03/11/17 178 lb 3.2 oz (80.8 kg) (95 %, Z= 1.61)*   * Growth percentiles are based on CDC (Girls, 2-20 Years) data.   Ht Readings from Last 3 Encounters:  04/14/18 5' 1.97" (1.574 m) (18 %, Z= -0.91)*  12/09/17 5' 2.01" (1.575 m) (19 %, Z= -0.89)*  09/24/16 5' 2.28" (1.582 m) (22 %, Z= -0.77)*   * Growth percentiles are based on CDC (Girls, 2-20 Years) data.   There is no height or weight on file to calculate BMI.  Facility age limit for growth percentiles is 20 years. Facility age limit for growth percentiles is 20 years.  General: Well developed, well nourished female in no acute distress.  Appears stated age Head: Normocephalic, atraumatic.   Eyes:  Pupils equal and round. Sclera white.  No eye drainage.  Wearing glasses Ears/Nose/Mouth/Throat: Nares patent, no nasal drainage.  Normal dentition, mucous membranes moist.   Neck: No obvious thyromegaly Cardiovascular: Well perfused, no cyanosis Respiratory: No increased work of breathing.  No cough. Extremities: Moving extremities well.    Skin: No rash or lesions. Neurologic: alert and oriented, normal speech  Laboratory Evaluation: Results for orders placed or performed in visit on 12/09/17  POCT Glucose (Device for Home Use)  Result Value Ref Range   Glucose Fasting, POC  70 - 99 mg/dL   POC Glucose 77 70 - 99 mg/dl  POCT HgB [id number]  Result Value Ref Range   Hemoglobin A1C 4.8 4.0 - 5.6 %   HbA1c, POC (prediabetic range)  5.7 - 6.4 %   HbA1c, POC (controlled diabetic range)  0.0 - 7.0 %   A1c trend: 5.5% in 09/2015-->5.4% 04/2016-->5.1% 08/2016-->5.0% 02/2017-->4.8% 11/2017  Assessment/Plan: [name] [name] is a 23 y.o. female with history of oligomenorrhea, thinning hair, and hyperandrogenism who is doing well on combination OCPs.  Had  breakthrough bleeding when she missed one pill though otherwise tolerating these well.  No clinical signs of hyperandrogenism (acne, hair growth).    1. Oligomenorrhea, unspecified type/ 2. Hyperandrogenism/ 3. Thinning hair -Continue current OCP. -Discussed transition to PCP for OCP management in the future when she is through college.  -Encouraged to set a cell phone alarm to help remember medication -Encouraged physical activity daily -Advised not to smoke while taking OCP   Follow-up:   Return in about 3 months (around 01/18/2019).   Casimiro NeedleAshley Bashioum Jeany Seville, MD

## 2018-10-19 NOTE — Patient Instructions (Addendum)
  Feel free to contact our office during normal business hours at [phone] with questions or concerns. If you need [place] urgently after normal business hours, please call the above number to reach our answering service who will contact the on-call pediatric endocrinologist.  If you choose to communicate with [place] via MyChart, please do not send urgent messages as this inbox is NOT monitored on nights or weekends.  Urgent concerns should be discussed with the on-call pediatric endocrinologist.  Continue your current pill.  Please let me know if you have problems.

## 2019-01-19 ENCOUNTER — Ambulatory Visit (INDEPENDENT_AMBULATORY_CARE_PROVIDER_SITE_OTHER): Payer: No Typology Code available for payment source | Admitting: Pediatrics

## 2019-01-19 ENCOUNTER — Other Ambulatory Visit: Payer: Self-pay

## 2019-01-19 ENCOUNTER — Encounter (INDEPENDENT_AMBULATORY_CARE_PROVIDER_SITE_OTHER): Payer: Self-pay | Admitting: Pediatrics

## 2019-01-19 VITALS — BP 118/74 | HR 88 | Wt 177.6 lb

## 2019-01-19 DIAGNOSIS — E288 Other ovarian dysfunction: Secondary | ICD-10-CM

## 2019-01-19 DIAGNOSIS — N915 Oligomenorrhea, unspecified: Secondary | ICD-10-CM

## 2019-01-19 DIAGNOSIS — R634 Abnormal weight loss: Secondary | ICD-10-CM

## 2019-01-19 MED ORDER — NORETHIN ACE-ETH ESTRAD-FE 1.5-30 MG-MCG PO TABS: 1.0000 | ORAL_TABLET | Freq: Every day | ORAL | 3 refills | Status: DC

## 2019-01-19 NOTE — Patient Instructions (Signed)

## 2019-01-19 NOTE — Progress Notes (Signed)
Pediatric Endocrinology Consultation Follow-up Visit  [name] [name], [name] [name] 1995/08/17  [name] Housekeeper, MD  Chief Complaint: oligomenorrhea, thinning hair, hyperandrogenism  HPI: [name] [name] is a 23 y.o. female presenting for follow-up of the above concerns.  she attended this visit alone.    [name] was initially referred to [facility] in 09/2015 for oligomenorrhea and thinning hair noted when she was evaluated by Dr. Gaynell [name] for papilledema (neuro work-up of papilledema showed normal MRI/MRV with LP showing normal opening pressure and no abnormalities of CSF.  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  At her initial [facility] visit, she was noted to have thinning hair on her scalp and oligomenorrhea, so PCOS work-up was performed which showed elevated free testosterone, low SHBG, normal TFTs, normal prolactin.  She was started on an OCP (junel  Fe 1.5/30) to reduce androgen levels and increase SHBG to help regulate menses.  2. Since last visit on 10/19/18 ([place] visit), she has been well.  Continues on Junel Fe 1.5/30 Withdrawal bleeding during week of inactive pills: Yes, periods last about 5 days Spotting: None  Severe cramping: Cramping during the first day of menses, though not unbearable Acne: None Hair growth: None.  No changes in scalp hair thickness Smoking: No  ROS All systems reviewed with pertinent positives listed below; otherwise negative. Constitutional: Weight decreased 11 pounds since last visit.  Good appetite.  She has been more active recently; she tries to walk/jog 3 miles per day.   HEENT: Wears glasses/contacts; no recent change in vision Respiratory: No increased work of breathing currently GU: periods as above Musculoskeletal: No joint deformity Neuro: Normal affect Endocrine: As above Psych: Reports feeling well overall  Past Medical History:  Past Medical History:  Diagnosis Date  . Anxiety    . Heart murmur    as a baby,   . Obesity   . Oligomenorrhea    In 09/2015 had elevated free testosterone and low SHBG.  Started on Junel 1.5/30 OCPs.  . Vision abnormalities    wears contacts   Pregnancy complicated by diet controlled gestational diabetes and cholelithiasis, delivered at term, birth weight 6lb 7oz, birth length 19 inches, no NICU required  Meds: Outpatient Encounter Medications as of 01/19/2019  Medication Sig  . norethindrone-ethinyl estradiol-iron (JUNEL FE 1.5/30) 1.5-30 MG-MCG tablet Take 1 tablet by mouth daily.  . [DISCONTINUED] norethindrone-ethinyl estradiol-iron (JUNEL FE 1.5/30) 1.5-30 MG-MCG tablet Take 1 tablet by mouth daily.   No facility-administered encounter medications on file as of 01/19/2019.     Allergies: No Known Allergies  Surgical History: Past Surgical History:  Procedure Laterality Date  . None      Family History:  Family History  Problem Relation Age of Onset  . COPD Maternal Grandfather   . Asthma Maternal Grandfather   . Diabetes Maternal Grandfather   . Hypertension Maternal Grandfather   . Diabetes Maternal Grandmother   . Hypertension Maternal Grandmother   . Heart disease Other        valve issuses  . Hepatitis C Father   . Diabetes Maternal Aunt   No family hx of PCOS diagnosis though maternal aunt has thinning hair, facial hair, and diabetes with menstrual irregularities No family history of blood clot or stroke at an early age  Social History: Will resume this 8 [employer] in the fall.  Plans to live in an on-campus apartment.  Working at a [employer] in [facility] currently  Physical  Exam:  Vitals:   01/19/19 1005  BP: 118/74  Pulse: 88  Weight: 177 lb 9.6 oz (80.6 kg)   BP 118/74   Pulse 88   Wt 177 lb 9.6 oz (80.6 kg)   BMI 32.52 kg/m  Body mass index: body mass index is 32.52 kg/m. Growth percentile SmartLinks can only be used for patients less than 21 years old.   Wt Readings from Last 3  Encounters:  01/19/19 177 lb 9.6 oz (80.6 kg)  04/14/18 188 lb 9.6 oz (85.5 kg) (96 %, Z= 1.75)*  12/09/17 179 lb 12.8 oz (81.6 kg) (95 %, Z= 1.60)*   * Growth percentiles are based on CDC (Girls, 2-20 Years) data.   Ht Readings from Last 3 Encounters:  04/14/18 5' 1.97" (1.574 m) (18 %, Z= -0.91)*  12/09/17 5' 2.01" (1.575 m) (19 %, Z= -0.89)*  09/24/16 5' 2.28" (1.582 m) (22 %, Z= -0.77)*   * Growth percentiles are based on CDC (Girls, 2-20 Years) data.   Body mass index is 32.52 kg/m.  Facility age limit for growth percentiles is 20 years. Facility age limit for growth percentiles is 20 years.  General: Well developed, well nourished female in no acute distress.  Appears stated age Head: Normocephalic, atraumatic.   Eyes:  Pupils equal and round. EOMI.   Sclera white.  No eye drainage.   Ears/Nose/Mouth/Throat: Wearing a mask Neck: supple, no cervical lymphadenopathy, no thyromegaly Cardiovascular: Well perfused, no cyanosis, pulse normal Respiratory: No increased work of breathing.  No cough Extremities: warm, well perfused, cap refill < 2 sec.   Musculoskeletal: Normal muscle mass.  Normal strength Skin: warm, dry.  No rash or lesions.  No acne Neurologic: alert and oriented, normal speech, no tremor  Laboratory Evaluation: Results for orders placed or performed in visit on 12/09/17  POCT Glucose (Device for Home Use)  Result Value Ref Range   Glucose Fasting, POC  70 - 99 mg/dL   POC Glucose 77 70 - 99 mg/dl  POCT HgB [id number]  Result Value Ref Range   Hemoglobin A1C 4.8 4.0 - 5.6 %   HbA1c, POC (prediabetic range)  5.7 - 6.4 %   HbA1c, POC (controlled diabetic range)  0.0 - 7.0 %   A1c trend: 5.5% in 09/2015-->5.4% 04/2016-->5.1% 08/2016-->5.0% 02/2017-->4.8% 11/2017  Assessment/Plan: [name] [name] is a 23 y.o. female with history of oligomenorrhea, thinning hair, and hyperandrogenism who continues to do well on combination OCPs.  She has no clinical signs of  hyperandrogenism today.  She has lost weight due to recent increased physical activity.  1. Oligomenorrhea, unspecified type/ 2. Hyperandrogenism/ -Continue current OCP -Sent prescription to her pharmacy for OCP -Encouraged to contact me if she starts having spotting -Advised against smoking while on combination OCP -Discussed weight loss (likely due to healthy increase in physical activity).  We will continue to monitor weight closely at future visits   Follow-up:   Return in about 6 months (around 07/22/2019).   Casimiro NeedleAshley Bashioum Aracely Rickett, MD

## 2019-07-26 ENCOUNTER — Other Ambulatory Visit: Payer: Self-pay

## 2019-07-26 ENCOUNTER — Ambulatory Visit (INDEPENDENT_AMBULATORY_CARE_PROVIDER_SITE_OTHER): Payer: No Typology Code available for payment source | Admitting: Pediatrics

## 2019-07-26 ENCOUNTER — Encounter (INDEPENDENT_AMBULATORY_CARE_PROVIDER_SITE_OTHER): Payer: Self-pay | Admitting: Pediatrics

## 2019-07-26 VITALS — BP 116/76 | HR 64 | Ht 62.13 in | Wt 163.2 lb

## 2019-07-26 DIAGNOSIS — R634 Abnormal weight loss: Secondary | ICD-10-CM | POA: Diagnosis not present

## 2019-07-26 DIAGNOSIS — E288 Other ovarian dysfunction: Secondary | ICD-10-CM

## 2019-07-26 DIAGNOSIS — N915 Oligomenorrhea, unspecified: Secondary | ICD-10-CM | POA: Diagnosis not present

## 2019-07-26 MED ORDER — NORETHIN ACE-ETH ESTRAD-FE 1.5-30 MG-MCG PO TABS: 1.0000 | ORAL_TABLET | Freq: Every day | ORAL | 3 refills | Status: DC

## 2019-07-26 NOTE — Patient Instructions (Signed)

## 2019-07-26 NOTE — Progress Notes (Signed)
Pediatric Endocrinology Consultation Follow-up Visit  Jayani, Rozman 1995/10/07  [name], MD  Chief Complaint: oligomenorrhea, hyperandrogenism treated with combination OCP  HPI: [place] is a 24 y.o. female presenting for follow-up of the above concerns.  she attended this visit alone.     1. [place] was initially referred to [facility] in 09/2015 for oligomenorrhea and thinning hair noted when she was evaluated by Dr. Sharene Skeans for papilledema (neuro work-up of papilledema showed normal MRI/MRV with LP showing normal opening pressure and no abnormalities of CSF.  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  At her initial [facility] visit, she was noted to have thinning hair on her scalp and oligomenorrhea, so PCOS work-up was performed which showed elevated free testosterone, low SHBG, normal TFTs, normal prolactin.  She was started on an OCP (junel  Fe 1.5/30) to reduce androgen levels and increase SHBG to help regulate menses.  2. Since last visit on 01/19/2019, she has been well.  Continues on Junel Fe 1.5/30 Withdrawal bleeding during week of inactive pills: Yes, lasts 4 days at most Spotting: No Severe cramping: No Acne: none Hair growth on face: None.  Hair thicker on back of scalp (had hair thinning in the past) Smoking: No  ROS All systems reviewed with pertinent positives listed below; otherwise negative. Constitutional: Weight decreased 14lb since last visit 6 months ago.  Has been running/walking 2.5-3 miles daily.  Good appetite, eating well, not skipping meals.  Reports trying to lose weight and feels better with activity.  Sleeping well HEENT: Has contacts, no drastic changes recently.  No headaches Respiratory: No increased work of breathing currently GI: No constipation or diarrhea GU: periods as above Musculoskeletal: No joint deformity Neuro: Normal affect Endocrine: As above  Past Medical  History:  Past Medical History:  Diagnosis Date  . Anxiety   . Heart murmur    as a baby,   . Obesity   . Oligomenorrhea    In 09/2015 had elevated free testosterone and low SHBG.  Started on Junel 1.5/30 OCPs.  . Vision abnormalities    wears contacts   Pregnancy complicated by diet controlled gestational diabetes and cholelithiasis, delivered at term, birth weight 6lb 7oz, birth length 19 inches, no NICU required  Meds: Outpatient Encounter Medications as of 07/26/2019  Medication Sig  . norethindrone-ethinyl estradiol-iron (JUNEL FE 1.5/30) 1.5-30 MG-MCG tablet Take 1 tablet by mouth daily.   No facility-administered encounter medications on file as of 07/26/2019.    Allergies: No Known Allergies  Surgical History: Past Surgical History:  Procedure Laterality Date  . None      Family History:  Family History  Problem Relation Age of Onset  . COPD Maternal Grandfather   . Asthma Maternal Grandfather   . Diabetes Maternal Grandfather   . Hypertension Maternal Grandfather   . Diabetes Maternal Grandmother   . Hypertension Maternal Grandmother   . Heart disease Other        valve issuses  . Hepatitis C Father   . Diabetes Maternal Aunt   No family hx of PCOS diagnosis though maternal aunt has thinning hair, facial hair, and diabetes with menstrual irregularities No family history of blood clot or stroke at an early age  Social History: [employer], lives [place] campus. Working at a [employer] in [place] currently  Physical Exam:  Vitals:   07/26/19 1415  BP: 116/76  Pulse: 64  Weight: 163 lb 3.2 oz (  74 kg)  Height: 5' 2.13" (1.578 m)   BP 116/76   Pulse 64   Ht 5' 2.13" (1.578 m)   Wt 163 lb 3.2 oz (74 kg)   LMP 06/29/2019 (Within Weeks)   BMI 29.73 kg/m  Body mass index: body mass index is 29.73 kg/m. Growth percentile SmartLinks can only be used for patients less than 61 years old.   Wt Readings from Last 3 Encounters:  07/26/19 163 lb 3.2 oz (74  kg)  01/19/19 177 lb 9.6 oz (80.6 kg)  04/14/18 188 lb 9.6 oz (85.5 kg) (96 %, Z= 1.75)*   * Growth percentiles are based on CDC (Girls, 2-20 Years) data.   Ht Readings from Last 3 Encounters:  07/26/19 5' 2.13" (1.578 m)  04/14/18 5' 1.97" (1.574 m) (18 %, Z= -0.91)*  12/09/17 5' 2.01" (1.575 m) (19 %, Z= -0.89)*   * Growth percentiles are based on CDC (Girls, 2-20 Years) data.   Body mass index is 29.73 kg/m.  Facility age limit for growth percentiles is 20 years. Facility age limit for growth percentiles is 20 years.  General: Well developed, well nourished female in no acute distress.  Appears stated age Head: Normocephalic, atraumatic.   Eyes:  Pupils equal and round. EOMI.   Sclera white.  No eye drainage.   Ears/Nose/Mouth/Throat: Wearing a mask Neck: supple, no cervical lymphadenopathy, no thyromegaly Cardiovascular: regular rate, normal S1/S2, no murmurs Respiratory: No increased work of breathing.  Lungs clear to auscultation bilaterally.  No wheezes. Abdomen: soft, nontender, nondistended Extremities: warm, well perfused, cap refill < 2 sec.   Musculoskeletal: Normal muscle mass.  Normal strength Skin: warm, dry.  No rash or lesions. Neurologic: alert and oriented, normal speech, no tremor    Laboratory Evaluation: Results for orders placed or performed in visit on 12/09/17  POCT Glucose (Device for Home Use)  Result Value Ref Range   Glucose Fasting, POC  70 - 99 mg/dL   POC Glucose 77 70 - 99 mg/dl  POCT HgB A1C  Result Value Ref Range   Hemoglobin A1C 4.8 4.0 - 5.6 %   HbA1c, POC (prediabetic range)  5.7 - 6.4 %   HbA1c, POC (controlled diabetic range)  0.0 - 7.0 %   A1c trend: 5.5% in 09/2015-->5.4% 04/2016-->5.1% 08/2016-->5.0% 02/2017-->4.8% 11/2017  Assessment/Plan: [place] is a 24 y.o. female with history of oligomenorrhea, thinning hair, and hyperandrogenism who continues to do well on combination OCPs.  She has no clinical signs of  hyperandrogenism today. Weight has decreased due to increased physical activity.  1. Oligomenorrhea, unspecified type/ 2. Hyperandrogenism/ 3. Loss of weight -Continue current OCP -Sent prescription to her pharmacy for OCP -Encouraged to contact me with questions. -Discussed that exercise is good as long as it does not become obsessive or all-consuming and she continues to eat well.  Follow-up:   Return in about 6 months (around 01/23/2020).  >30 minutes spent today reviewing the medical chart, counseling the patient/family, and documenting today's encounter.  Levon Hedger, MD

## 2020-01-24 ENCOUNTER — Encounter (INDEPENDENT_AMBULATORY_CARE_PROVIDER_SITE_OTHER): Payer: Self-pay | Admitting: Pediatrics

## 2020-01-24 ENCOUNTER — Ambulatory Visit (INDEPENDENT_AMBULATORY_CARE_PROVIDER_SITE_OTHER): Payer: No Typology Code available for payment source | Admitting: Pediatrics

## 2020-01-24 ENCOUNTER — Other Ambulatory Visit: Payer: Self-pay

## 2020-01-24 VITALS — BP 128/71 | HR 59 | Wt 152.4 lb

## 2020-01-24 DIAGNOSIS — R634 Abnormal weight loss: Secondary | ICD-10-CM | POA: Diagnosis not present

## 2020-01-24 DIAGNOSIS — E288 Other ovarian dysfunction: Secondary | ICD-10-CM

## 2020-01-24 DIAGNOSIS — N915 Oligomenorrhea, unspecified: Secondary | ICD-10-CM | POA: Diagnosis not present

## 2020-01-24 MED ORDER — NORETHIN ACE-ETH ESTRAD-FE 1.5-30 MG-MCG PO TABS: ORAL_TABLET | ORAL | 4 refills | Status: DC

## 2020-01-24 NOTE — Progress Notes (Signed)
Pediatric Endocrinology Consultation Follow-up Visit  [name] [name], [name] [name] 06/18/1996  [name], MD  Chief Complaint: oligomenorrhea, hyperandrogenism treated with combination OCP  HPI: [name] [name] is a 24 y.o. female presenting for follow-up of the above concerns.  she attended this visit alone.     1. [place] was initially referred to [facility] in 09/2015 for oligomenorrhea and thinning hair noted when she was evaluated by Dr. Sharene Skeans for papilledema (neuro work-up of papilledema showed normal MRI/MRV with LP showing normal opening pressure and no abnormalities of CSF.  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  At her initial [facility] visit, she was noted to have thinning hair on her scalp and oligomenorrhea, so PCOS work-up was performed which showed elevated free testosterone, low SHBG, normal TFTs, normal prolactin.  She was started on an OCP (junel  Fe 1.5/30) to reduce androgen levels and increase SHBG to help regulate menses.  2. Since last visit on 07/26/19, she has been well.  Continues on Junel Fe 1.5/30 Has recently started taking 9 weeks of active pills straight then having withdrawal bleed during the 10th week Withdrawal bleeding during week of inactive pills: yes Spotting: No Severe cramping: No Acne: has flared recently, thinks its related to work Hair growth on face: None Smoking: No  Continues to be physically active.   Goal is to run 3 miles 5 times per week, though will often only run 3 times per week and then hike the other 2 days.  Reports that she is still eating, though less predictably since working at her current job (if works a long shift she sometimes doesn't get to eat).  Is planning on 3 meals per day when she returns to school in the Fall.  Will [place] campus.Weight down 11lb since 06/2019.  ROS All systems reviewed with pertinent positives listed below; otherwise negative. Constitutional: No  dizziness, good sleep, good energy, no vision changes  Past Medical History:  Past Medical History:  Diagnosis Date  . Anxiety   . Heart murmur    as a baby,   . Obesity   . Oligomenorrhea    In 09/2015 had elevated free testosterone and low SHBG.  Started on Junel 1.5/30 OCPs.  . Vision abnormalities    wears contacts   Pregnancy complicated by diet controlled gestational diabetes and cholelithiasis, delivered at term, birth weight 6lb 7oz, birth length 19 inches, no NICU required  Meds: Outpatient Encounter Medications as of 01/24/2020  Medication Sig  . norethindrone-ethinyl estradiol-iron (JUNEL FE 1.5/30) 1.5-30 MG-MCG tablet Take active pills only x 9 weeks, then 1 week of inactive pills.  . [DISCONTINUED] norethindrone-ethinyl estradiol-iron (JUNEL FE 1.5/30) 1.5-30 MG-MCG tablet Take 1 tablet by mouth daily.   No facility-administered encounter medications on file as of 01/24/2020.    Allergies: No Known Allergies  Surgical History: Past Surgical History:  Procedure Laterality Date  . None      Family History:  Family History  Problem Relation Age of Onset  . COPD Maternal Grandfather   . Asthma Maternal Grandfather   . Diabetes Maternal Grandfather   . Hypertension Maternal Grandfather   . Diabetes Maternal Grandmother   . Hypertension Maternal Grandmother   . Heart disease Other        valve issuses  . Hepatitis C Father   . Diabetes Maternal Aunt   No family hx of PCOS diagnosis though maternal aunt has thinning hair, facial hair, and diabetes  with menstrual irregularities No family history of blood clot or stroke at an early age  Social History: [employer], lives on campus. Home for the summer.  Physical Exam:  Vitals:   01/24/20 1513  BP: 128/71  Pulse: 59  Weight: 152 lb 6.4 oz (69.1 kg)   BP 128/71   Pulse 59   Wt 152 lb 6.4 oz (69.1 kg)   LMP 10/26/2019 (Approximate)   BMI 27.76 kg/m  Body mass index: body mass index is 27.76 kg/m. Growth  percentile SmartLinks can only be used for patients less than 20 years old.   Wt Readings from Last 3 Encounters:  01/24/20 152 lb 6.4 oz (69.1 kg)  07/26/19 163 lb 3.2 oz (74 kg)  01/19/19 177 lb 9.6 oz (80.6 kg)   Ht Readings from Last 3 Encounters:  07/26/19 5' 2.13" (1.578 m)  04/14/18 5' 1.97" (1.574 m) (18 %, Z= -0.91)*  12/09/17 5' 2.01" (1.575 m) (19 %, Z= -0.89)*   * Growth percentiles are based on CDC (Girls, 2-20 Years) data.   Body mass index is 27.76 kg/m.  Facility age limit for growth percentiles is 20 years. Facility age limit for growth percentiles is 20 years.  General: Well developed, well nourished female in no acute distress.  Appears stated age Head: Normocephalic, atraumatic.   Eyes:  Pupils equal and round. EOMI.   Sclera white.  No eye drainage.   Ears/Nose/Mouth/Throat: Masked Neck: supple, no cervical lymphadenopathy, no thyromegaly Cardiovascular: regular rate, normal S1/S2, no murmurs Respiratory: No increased work of breathing.  Lungs clear to auscultation bilaterally.  No wheezes. Abdomen: soft, nontender, nondistended.  Extremities: warm, well perfused, cap refill < 2 sec.   Musculoskeletal: Normal muscle mass.  Normal strength Skin: warm, dry.  No rash or lesions. Neurologic: alert and oriented, normal speech, no tremor  Laboratory Evaluation: A1c trend: 5.5% in 09/2015-->5.4% 04/2016-->5.1% 08/2016-->5.0% 02/2017-->4.8% 11/2017  Assessment/Plan: [name] [name] is a 24 y.o. female with history of oligomenorrhea, thinning hair, and hyperandrogenism who is doing well on combination OCPs.  She has no clinical signs of hyperandrogenism today. Weight has continued to drop, likely due to increased physical activity.    1. Oligomenorrhea, unspecified type/ 2. Hyperandrogenism/ 3. Loss of weight -Continue current OCP.  Discussed taking 9 weeks of active pills and 1 week of inactive pills to allow withdrawal bleed; new rx sent for this.  Explained that  side effect may be spotting. -Encouraged healthy eating. -Discussed that we need to think about transition to adult provider given her age; will see her back in 6 months then will refer back to PCP for management.  Follow-up:   Return in about 6 months (around 07/26/2020).  >30 minutes spent today reviewing the medical chart, counseling the patient/family, and documenting today's encounter.  Casimiro Needle, MD

## 2020-01-24 NOTE — Patient Instructions (Signed)

## 2020-07-31 ENCOUNTER — Ambulatory Visit (INDEPENDENT_AMBULATORY_CARE_PROVIDER_SITE_OTHER): Payer: No Typology Code available for payment source | Admitting: Pediatrics

## 2020-07-31 ENCOUNTER — Other Ambulatory Visit: Payer: Self-pay

## 2020-07-31 ENCOUNTER — Encounter (INDEPENDENT_AMBULATORY_CARE_PROVIDER_SITE_OTHER): Payer: Self-pay | Admitting: Pediatrics

## 2020-07-31 VITALS — BP 123/65 | HR 64 | Wt 158.2 lb

## 2020-07-31 DIAGNOSIS — E288 Other ovarian dysfunction: Secondary | ICD-10-CM

## 2020-07-31 DIAGNOSIS — N915 Oligomenorrhea, unspecified: Secondary | ICD-10-CM | POA: Diagnosis not present

## 2020-07-31 MED ORDER — NORETHIN ACE-ETH ESTRAD-FE 1.5-30 MG-MCG PO TABS: ORAL_TABLET | ORAL | 4 refills | Status: DC

## 2020-07-31 NOTE — Patient Instructions (Signed)

## 2020-07-31 NOTE — Progress Notes (Signed)
Pediatric Endocrinology Consultation Follow-up Visit  [place], Bisher 20-Feb-1996  [name] Catching, MD  Chief Complaint: oligomenorrhea, hyperandrogenism treated with combination OCP  HPI: [name] [name] is a 25 y.o. female presenting for follow-up of the above concerns.  she attended this visit alone.     1.  was initially referred to [facility] in 09/2015 for oligomenorrhea and thinning hair noted when she was evaluated by Dr. Sharene Skeans for papilledema (neuro work-up of papilledema showed normal MRI/MRV with LP showing normal opening pressure and no abnormalities of CSF.  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  At her initial [facility] visit, she was noted to have thinning hair on her scalp and oligomenorrhea, so PCOS work-up was performed which showed elevated free testosterone, low SHBG, normal TFTs, normal prolactin.  She was started on an OCP (junel  Fe 1.5/30) to reduce androgen levels and increase SHBG to help regulate menses.  2. Since last visit on 01/24/20, she has been well.  Continues on Junel Fe 1.5/30 Currently taking 9 weeks of pills then having withdrawal bleed Withdrawal bleeding during week of inactive pills: yes Spotting: No Severe cramping: No Acne: No Hair growth on face: None Smoking: No  ROS All systems reviewed with pertinent positives listed below; otherwise negative. Constitutional: Weight has increased 6lb since last visit. Still active, goal is to run 3 miles 5 days per week   Past Medical History:  Past Medical History:  Diagnosis Date  . Anxiety   . Heart murmur    as a baby,   . Obesity   . Oligomenorrhea    In 09/2015 had elevated free testosterone and low SHBG.  Started on Junel 1.5/30 OCPs.  . Vision abnormalities    wears contacts   Pregnancy complicated by diet controlled gestational diabetes and cholelithiasis, delivered at term, birth weight 6lb 7oz, birth length 19 inches, no  NICU required  Meds: Outpatient Encounter Medications as of 07/31/2020  Medication Sig  . norethindrone-ethinyl estradiol-iron (JUNEL FE 1.5/30) 1.5-30 MG-MCG tablet Take active pills only x 9 weeks, then 1 week of inactive pills.   No facility-administered encounter medications on file as of 07/31/2020.    Allergies: No Known Allergies  Surgical History: Past Surgical History:  Procedure Laterality Date  . None      Family History:  Family History  Problem Relation Age of Onset  . COPD Maternal Grandfather   . Asthma Maternal Grandfather   . Diabetes Maternal Grandfather   . Hypertension Maternal Grandfather   . Diabetes Maternal Grandmother   . Hypertension Maternal Grandmother   . Heart disease Other        valve issuses  . Hepatitis C Father   . Diabetes Maternal Aunt   No family hx of PCOS diagnosis though maternal aunt has thinning hair, facial hair, and diabetes with menstrual irregularities No family history of blood clot or stroke at an early age  Social History: [employer], lives on campus. Works at [employer]  Will graduate in Dec 2022.  Physical Exam:  Vitals:   07/31/20 1554  BP: 123/65  Pulse: 64  Weight: 158 lb 3.2 oz (71.8 kg)   BP 123/65   Pulse 64   Wt 158 lb 3.2 oz (71.8 kg)   LMP 06/01/2020   BMI 28.82 kg/m  Body mass index: body mass index is 28.82 kg/m. Growth percentile SmartLinks can only be used for patients less than 22 years old.   Wt  Readings from Last 3 Encounters:  07/31/20 158 lb 3.2 oz (71.8 kg)  01/24/20 152 lb 6.4 oz (69.1 kg)  07/26/19 163 lb 3.2 oz (74 kg)   Ht Readings from Last 3 Encounters:  07/26/19 5' 2.13" (1.578 m)  04/14/18 5' 1.97" (1.574 m) (18 %, Z= -0.91)*  12/09/17 5' 2.01" (1.575 m) (19 %, Z= -0.89)*   * Growth percentiles are based on CDC (Girls, 2-20 Years) data.   Body mass index is 28.82 kg/m.  Facility age limit for growth percentiles is 20 years. Facility age limit for growth percentiles is 20  years.  General: Well developed, well nourished female in no acute distress.  Appears stated age Head: Normocephalic, atraumatic.   Eyes:  Pupils equal and round. EOMI.   Sclera white.  No eye drainage.   Ears/Nose/Mouth/Throat: Masked Neck: supple, no cervical lymphadenopathy, no thyromegaly Cardiovascular: regular rate, normal S1/S2, no murmurs Respiratory: No increased work of breathing.  Lungs clear to auscultation bilaterally.  No wheezes. Abdomen: soft, nontender, nondistended.  Extremities: warm, well perfused, cap refill < 2 sec.   Musculoskeletal: Normal muscle mass.  Normal strength Skin: warm, dry.  No rash or acne. No obvious facial hair Neurologic: alert and oriented, normal speech, no tremor   Laboratory Evaluation: A1c trend: 5.5% in 09/2015-->5.4% 04/2016-->5.1% 08/2016-->5.0% 02/2017-->4.8% 11/2017  Assessment/Plan: [name] [name] is a 25 y.o. female with history of oligomenorrhea, thinning hair, and hyperandrogenism who is doing well on combination OCPs.  She has no clinical signs of hyperandrogenism today. Weight has increased; she continues with physical activity.   1. Oligomenorrhea, unspecified type/ 2. Hyperandrogenism -Continue current OCP.  Continue taking 9 weeks of active pills and 1 week of inactive pills to allow withdrawal bleed; rx sent for with 4 refills. -Encouraged healthy eating. -Encouraged to make follow-up with PCP who can manage OCPs. Advised to call if she needs anything prior to transitioning   Follow-up:   Return if symptoms worsen or fail to improve.  >30 minutes spent today reviewing the medical chart, counseling the patient/family, and documenting today's encounter.   Casimiro Needle, MD

## 2021-08-26 ENCOUNTER — Ambulatory Visit: Payer: No Typology Code available for payment source | Admitting: Nurse Practitioner

## 2021-10-11 ENCOUNTER — Encounter: Payer: Self-pay | Admitting: Nurse Practitioner

## 2021-10-13 NOTE — Telephone Encounter (Signed)
Please advise 

## 2021-10-14 NOTE — Telephone Encounter (Signed)
Called pt no answer left vm  will send via mychart ?

## 2021-10-15 ENCOUNTER — Encounter: Payer: Self-pay | Admitting: Family Medicine

## 2021-10-15 ENCOUNTER — Ambulatory Visit: Payer: BC Managed Care – PPO | Admitting: Family Medicine

## 2021-10-15 DIAGNOSIS — Z114 Encounter for screening for human immunodeficiency virus [HIV]: Secondary | ICD-10-CM

## 2021-10-15 DIAGNOSIS — E559 Vitamin D deficiency, unspecified: Secondary | ICD-10-CM | POA: Diagnosis not present

## 2021-10-15 DIAGNOSIS — Z1159 Encounter for screening for other viral diseases: Secondary | ICD-10-CM

## 2021-10-15 DIAGNOSIS — N921 Excessive and frequent menstruation with irregular cycle: Secondary | ICD-10-CM

## 2021-10-15 DIAGNOSIS — Z23 Encounter for immunization: Secondary | ICD-10-CM | POA: Diagnosis not present

## 2021-10-15 DIAGNOSIS — R7301 Impaired fasting glucose: Secondary | ICD-10-CM | POA: Diagnosis not present

## 2021-10-15 NOTE — Progress Notes (Signed)
w

## 2021-10-15 NOTE — Patient Instructions (Addendum)
I appreciate the opportunity to provide care to you today! ? -Please stop by the lab anytime this week to have your labs drawn ? ?Missed pills:  ?1 pill missed: take as soon as remembered. If not remembered until the time for next pill, take 2 pills. ?2 consecutive pills missed: take 2 pills per day for the next 2 days, then resume 1 pill per day. Use additional contraceptives for the remainder of cycle. ? ?Breakthrough bleeding (BTB) ?-BTB does not indicate a decrease in pill effectiveness  ?-Missing pills cause most breakthrough bleeding ? ?  ?It was a pleasure to see you and I look forward to continuing to work together on your health and well-being. ?Please do not hesitate to call the office if you need care or have questions about your care. ?  ?Have a wonderful day and week. ?With Gratitude, ?Alvira Monday MSN, FNP-BC ? ?

## 2021-10-15 NOTE — Telephone Encounter (Signed)
Pt has been informed.

## 2021-10-15 NOTE — Assessment & Plan Note (Signed)
-  Informed the patient that missing pills cause most breakthrough bleeding and does not indicate a decrease in pill effectiveness.  ?-Breakthrough bleeding and spotting are likely due to the patient taking 1 pill rather than 2 pills after missing a day of COC ? ? Instructions given for Missed Pills: ?-1 pill missed: take as soon as remembered. If you do not remember until the next pill, take 2 pills. ?-2 consecutive pills missed: take 2 pills per day for the next 2 days, then resume 1 pill per day. Use additional contraceptives for the remainder of cycle ? ? ? ? ? ?

## 2021-10-15 NOTE — Progress Notes (Signed)
? ?Established Patient Office Visit ? ?Subjective:  ?Patient ID: [name] [name], female    DOB: 09/05/1995  Age: 26 y.o. MRN: [id number] ? ?CC:  ?Chief Complaint  ?Patient presents with  ? New Patient (Initial Visit)  ?  Pt would like to discuss birth control, she has been having irregularity with her cycles.   ? ? ?HPI ?[name] [name] is a 26 y.o. Female with PMH of dysmenorrhea presents today to establish care. she started on COC because of hormonal imbalance and dysmenorrhea.The patient has been having breakthrough bleeding and light spotting since missing a pill day. She has been COC for  6-7 years with no complaints or problems. Recently she has been having breakthrough bleeding and light spotting since missing a day of her contraceptive. She reports taking 1 pill the next day and completing the pill pack as intended. Breakthrough bleeding has resolved, but she still has some light spotting. She denies several abdominal pain, chest pain,m headaches, eye problems, and leg pain. She will be starting a new pack after 10/17/2021. ? ?HPV and TDAP vaccine given.  ?Past Medical History:  ?Diagnosis Date  ? Anxiety   ? Heart murmur   ? as a baby,   ? Obesity   ? Oligomenorrhea   ? In 09/2015 had elevated free testosterone and low SHBG.  Started on Junel 1.5/30 OCPs.  ? Vision abnormalities   ? wears contacts  ? ? ?Past Surgical History:  ?Procedure Laterality Date  ? None    ? ? ?Family History  ?Problem Relation Age of Onset  ? COPD Maternal Grandfather   ? Asthma Maternal Grandfather   ? Diabetes Maternal Grandfather   ? Hypertension Maternal Grandfather   ? Diabetes Maternal Grandmother   ? Hypertension Maternal Grandmother   ? Heart disease Other   ?     valve issuses  ? Hepatitis C Father   ? Diabetes Maternal Aunt   ? ? ?Social History  ? ?Socioeconomic History  ? Marital status: Single  ?  Spouse name: Not on file  ? Number of children: Not on file  ? Years of education: Not on file  ? Highest education level:  Not on file  ?Occupational History  ? Not on file  ?Tobacco Use  ? Smoking status: Never  ? Smokeless tobacco: Never  ?Substance and Sexual Activity  ? Alcohol use: No  ?  Alcohol/week: 0.0 standard drinks  ? Drug use: No  ? Sexual activity: Never  ?Other Topics Concern  ? Not on file  ?Social History Narrative  ? 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  She lives her mom during the summer and in the dorms during school. She enjoys everything about music.   ? ?Social Determinants of Health  ? ?Financial Resource Strain: Not on file  ?Food Insecurity: Not on file  ?Transportation Needs: Not on file  ?Physical Activity: Not on file  ?Stress: Not on file  ?Social Connections: Not on file  ?Intimate Partner Violence: Not on file  ? ? ?Outpatient Medications Prior to Visit  ?Medication Sig Dispense Refill  ? norethindrone-ethinyl estradiol-iron (JUNEL FE 1.5/30) 1.5-30 MG-MCG tablet Take active pills only x 9 weeks, then 1 week of inactive pills. 84 tablet 4  ? ?No facility-administered medications prior to visit.  ? ? ?No Known Allergies ? ?ROS ?Review of Systems ? ?  ?Objective:  ?  ?Physical Exam ?Constitutional:   ?   Appearance: Normal appearance.  ?  HENT:  ?   Head: Normocephalic.  ?   Right Ear: External ear normal.  ?   Nose: No congestion or rhinorrhea.  ?   Mouth/Throat:  ?   Mouth: Mucous membranes are moist.  ?   Pharynx: No oropharyngeal exudate or posterior oropharyngeal erythema.  ?Eyes:  ?   Extraocular Movements: Extraocular movements intact.  ?   Pupils: Pupils are equal, round, and reactive to light.  ?Cardiovascular:  ?   Rate and Rhythm: Normal rate and regular rhythm.  ?   Pulses: Normal pulses.  ?   Heart sounds: Normal heart sounds.  ?Pulmonary:  ?   Effort: Pulmonary effort is normal.  ?   Breath sounds: Normal breath sounds.  ?Abdominal:  ?   General: Bowel sounds are normal.  ?   Palpations: Abdomen is soft.  ?   Tenderness: There is no right CVA tenderness or left CVA  tenderness.  ?Genitourinary: ?   Comments: deferred ?Musculoskeletal:  ?   Cervical back: Normal range of motion.  ?   Right lower leg: No edema.  ?   Left lower leg: No edema.  ?Lymphadenopathy:  ?   Cervical: No cervical adenopathy.  ?Skin: ?   General: Skin is warm.  ?   Capillary Refill: Capillary refill takes less than 2 seconds.  ?Neurological:  ?   Mental Status: She is alert and oriented to person, place, and time.  ?Psychiatric:  ?   Comments: Normal affect  ? ? ?BP 116/72 (BP Location: Right Arm, Patient Position: Sitting)   Pulse 75   Ht 5' 2.5" (1.588 m)   Wt 179 lb 6.4 oz (81.4 kg)   SpO2 99%   BMI 32.29 kg/m?  ?Wt Readings from Last 3 Encounters:  ?10/15/21 179 lb 6.4 oz (81.4 kg)  ?07/31/20 158 lb 3.2 oz (71.8 kg)  ?01/24/20 152 lb 6.4 oz (69.1 kg)  ? ? ?Lab Results  ?Component Value Date  ? TSH 1.71 10/18/2015  ? ?No results found for: WBC, HGB, HCT, MCV, PLT ?No results found for: NA, K, CHLORIDE, CO2, GLUCOSE, BUN, CREATININE, BILITOT, ALKPHOS, AST, ALT, PROT, ALBUMIN, CALCIUM, ANIONGAP, EGFR, GFR ?No results found for: CHOL ?No results found for: HDL ?No results found for: [place] ?No results found for: TRIG ?No results found for: CHOLHDL ?Lab Results  ?Component Value Date  ? HGBA1C 4.8 12/09/2017  ? ? ?  ?Assessment & Plan:  ? ?Problem List Items Addressed This Visit   ? ?  ? Other  ? Breakthrough bleeding on birth control pills  ?  -Informed the patient that missing pills cause most breakthrough bleeding and does not indicate a decrease in pill effectiveness.  ?-Breakthrough bleeding and spotting are likely due to the patient taking 1 pill rather than 2 pills after missing a day of COC ? ? Instructions given for Missed Pills: ?-1 pill missed: take as soon as remembered. If you do not remember until the next pill, take 2 pills. ?-2 consecutive pills missed: take 2 pills per day for the next 2 days, then resume 1 pill per day. Use additional contraceptives for the remainder of  cycle ? ? ? ? ? ?  ?  ? Relevant Orders  ? CBC with Differential/Platelet  ? CMP14+EGFR  ? Lipid panel  ? TSH + free T4  ? ?Other Visit Diagnoses   ? ? IFG (impaired fasting glucose)      ? Relevant Orders  ? Hemoglobin A1C  ? Vitamin  D deficiency      ? Relevant Orders  ? Vitamin D (25 hydroxy)  ? Encounter for screening for HIV      ? Relevant Orders  ? HIV antibody (with reflex)  ? Need for hepatitis C screening test      ? Relevant Orders  ? Hepatitis C Antibody  ? Need for HPV vaccination      ? Relevant Orders  ? HPV 9-valent vaccine,Recombinat (Completed)  ? Need for Tdap vaccination      ? Relevant Orders  ? Tdap vaccine greater than or equal to 7yo IM (Completed)  ? ?  ? ? ?No orders of the defined types were placed in this encounter. ? ? ?Follow-up: Return in about 3 months (around 01/14/2022).  ? ? ?Alvira Monday, FNP ?

## 2021-10-16 LAB — CBC WITH DIFFERENTIAL/PLATELET
Basophils Absolute: 0 10*3/uL (ref 0.0–0.2)
Basos: 1 %
EOS (ABSOLUTE): 0 10*3/uL (ref 0.0–0.4)
Eos: 0 %
Hematocrit: 41.3 % (ref 34.0–46.6)
Hemoglobin: 14.1 g/dL (ref 11.1–15.9)
Immature Grans (Abs): 0 10*3/uL (ref 0.0–0.1)
Immature Granulocytes: 0 %
Lymphocytes Absolute: 1.6 10*3/uL (ref 0.7–3.1)
Lymphs: 34 %
MCH: 29.9 pg (ref 26.6–33.0)
MCHC: 34.1 g/dL (ref 31.5–35.7)
MCV: 88 fL (ref 79–97)
Monocytes Absolute: 0.3 10*3/uL (ref 0.1–0.9)
Monocytes: 6 %
Neutrophils Absolute: 2.8 10*3/uL (ref 1.4–7.0)
Neutrophils: 59 %
Platelets: 279 10*3/uL (ref 150–450)
RBC: 4.72 x10E6/uL (ref 3.77–5.28)
RDW: 12.1 % (ref 11.7–15.4)
WBC: 4.7 10*3/uL (ref 3.4–10.8)

## 2021-10-16 LAB — VITAMIN D 25 HYDROXY (VIT D DEFICIENCY, FRACTURES): Vit D, 25-Hydroxy: 25.4 ng/mL — ABNORMAL LOW (ref 30.0–100.0)

## 2021-10-16 LAB — CMP14+EGFR
ALT: 27 IU/L (ref 0–32)
AST: 30 IU/L (ref 0–40)
Albumin/Globulin Ratio: 1.5 (ref 1.2–2.2)
Albumin: 4.5 g/dL (ref 3.9–5.0)
Alkaline Phosphatase: 34 IU/L — ABNORMAL LOW (ref 44–121)
BUN/Creatinine Ratio: 11 (ref 9–23)
BUN: 9 mg/dL (ref 6–20)
Bilirubin Total: 0.3 mg/dL (ref 0.0–1.2)
CO2: 22 mmol/L (ref 20–29)
Calcium: 9.7 mg/dL (ref 8.7–10.2)
Chloride: 104 mmol/L (ref 96–106)
Creatinine, Ser: 0.79 mg/dL (ref 0.57–1.00)
Globulin, Total: 3 g/dL (ref 1.5–4.5)
Glucose: 89 mg/dL (ref 70–99)
Potassium: 4.5 mmol/L (ref 3.5–5.2)
Sodium: 141 mmol/L (ref 134–144)
Total Protein: 7.5 g/dL (ref 6.0–8.5)
eGFR: 108 mL/min/{1.73_m2} (ref >59)

## 2021-10-16 LAB — HEMOGLOBIN A1C
Est. average glucose Bld gHb Est-mCnc: 100 mg/dL
Hgb A1c MFr Bld: 5.1 % (ref 4.8–5.6)

## 2021-10-16 LAB — LIPID PANEL
Chol/HDL Ratio: 3.1 ratio (ref 0.0–4.4)
Cholesterol, Total: 177 mg/dL (ref 100–199)
HDL: 58 mg/dL (ref >39)
LDL Chol Calc (NIH): 106 mg/dL — ABNORMAL HIGH (ref 0–99)
Triglycerides: 69 mg/dL (ref 0–149)
VLDL Cholesterol Cal: 13 mg/dL (ref 5–40)

## 2021-10-16 LAB — TSH+FREE T4
Free T4: 1.32 ng/dL (ref 0.82–1.77)
TSH: 1.06 u[IU]/mL (ref 0.450–4.500)

## 2021-10-16 LAB — HEPATITIS C ANTIBODY: Hep C Virus Ab: NONREACTIVE

## 2021-10-16 LAB — HIV ANTIBODY (ROUTINE TESTING W REFLEX): HIV Screen 4th Generation wRfx: NONREACTIVE

## 2021-11-03 ENCOUNTER — Other Ambulatory Visit: Payer: Self-pay | Admitting: Family Medicine

## 2021-11-03 DIAGNOSIS — N915 Oligomenorrhea, unspecified: Secondary | ICD-10-CM

## 2021-11-03 MED ORDER — NORETHIN ACE-ETH ESTRAD-FE 1.5-30 MG-MCG PO TABS: ORAL_TABLET | ORAL | 4 refills | Status: DC

## 2021-11-03 NOTE — Telephone Encounter (Signed)
?  I've sent a refill of her birth control pills

## 2021-11-03 NOTE — Telephone Encounter (Signed)
This is Gloria's pt  ?

## 2021-11-04 ENCOUNTER — Ambulatory Visit: Payer: No Typology Code available for payment source | Admitting: Nurse Practitioner

## 2022-01-14 ENCOUNTER — Ambulatory Visit: Payer: BC Managed Care – PPO | Admitting: Family Medicine

## 2022-01-19 ENCOUNTER — Ambulatory Visit: Payer: BC Managed Care – PPO | Admitting: Family Medicine

## 2022-01-19 ENCOUNTER — Encounter: Payer: Self-pay | Admitting: Family Medicine

## 2022-01-19 VITALS — BP 132/78 | HR 65 | Ht 62.5 in | Wt 184.4 lb

## 2022-01-19 DIAGNOSIS — N921 Excessive and frequent menstruation with irregular cycle: Secondary | ICD-10-CM | POA: Diagnosis not present

## 2022-01-19 DIAGNOSIS — Z6833 Body mass index (BMI) 33.0-33.9, adult: Secondary | ICD-10-CM

## 2022-01-19 DIAGNOSIS — E559 Vitamin D deficiency, unspecified: Secondary | ICD-10-CM | POA: Diagnosis not present

## 2022-01-19 DIAGNOSIS — R7301 Impaired fasting glucose: Secondary | ICD-10-CM | POA: Diagnosis not present

## 2022-01-19 DIAGNOSIS — E6609 Other obesity due to excess calories: Secondary | ICD-10-CM

## 2022-01-19 NOTE — Assessment & Plan Note (Signed)
Encouraged heart healthy diet and increasing physical activities

## 2022-01-19 NOTE — Patient Instructions (Addendum)
I appreciate the opportunity to provide care to you today!    Follow up:  4 months CPE  Labs: please stop by the lab  during the week to get your blood drawn (CBC, CMP, TSH, Lipid profile, HgA1c, Vit D)      Please continue to a heart-healthy diet and increase your physical activities. Try to exercise for 30mins at least three times a week.      It was a pleasure to see you and I look forward to continuing to work together on your health and well-being. Please do not hesitate to call the office if you need care or have questions about your care.   Have a wonderful day and week. With Gratitude, Youssef Footman MSN, FNP-BC  

## 2022-01-19 NOTE — Assessment & Plan Note (Signed)
Resolved No complaints or concerns Complaint with COC

## 2022-01-19 NOTE — Progress Notes (Signed)
Established Patient Office Visit  Subjective:  Patient ID: [name] [name], female    DOB: 11-30-1995  Age: 26 y.o. MRN: [id number]  CC:  Chief Complaint  Patient presents with   Follow-up    3 month follow up    HPI [name] [name] is a 26 y.o. female with past medical history of hyperandrogenism presents for  chronic medical conditions.  Breakthrough bleeding: Resolved. No complaints or concerns. Complaint with COC.   Past Medical History:  Diagnosis Date   Anxiety    Heart murmur    as a baby,    Obesity    Oligomenorrhea    In 09/2015 had elevated free testosterone and low SHBG.  Started on Junel 1.5/30 OCPs.   Vision abnormalities    wears contacts    Past Surgical History:  Procedure Laterality Date   None      Family History  Problem Relation Age of Onset   COPD Maternal Grandfather    Asthma Maternal Grandfather    Diabetes Maternal Grandfather    Hypertension Maternal Grandfather    Diabetes Maternal Grandmother    Hypertension Maternal Grandmother    Heart disease Other        valve issuses   Hepatitis C Father    Diabetes Maternal Aunt     Social History   Socioeconomic History   Marital status: Single    Spouse name: Not on file   Number of children: Not on file   Years of education: Not on file   Highest education level: Not on file  Occupational History   Not on file  Tobacco Use   Smoking status: Never   Smokeless tobacco: Never  Substance and Sexual Activity   Alcohol use: No    Alcohol/week: 0.0 standard drinks of alcohol   Drug use: No   Sexual activity: Never  Other Topics Concern   Not on file  Social History Narrative   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  She lives her mom during the summer and in the dorms during school. She enjoys everything about music.    Social Determinants of Health   Financial Resource Strain: Not on file  Food Insecurity: Not on file  Transportation Needs: Not on  file  Physical Activity: Not on file  Stress: Not on file  Social Connections: Not on file  Intimate Partner Violence: Not on file    Outpatient Medications Prior to Visit  Medication Sig Dispense Refill   norethindrone-ethinyl estradiol-iron (JUNEL FE 1.5/30) 1.5-30 MG-MCG tablet Take active pills only x 9 weeks, then 1 week of inactive pills. 84 tablet 4   No facility-administered medications prior to visit.    No Known Allergies  ROS Review of Systems  Constitutional:  Negative for fatigue and fever.  HENT:  Negative for sinus pressure and sinus pain.   Eyes:  Negative for pain and redness.  Respiratory:  Negative for chest tightness and shortness of breath.   Endocrine: Negative for polydipsia, polyphagia and polyuria.  Genitourinary:  Negative for vaginal bleeding and vaginal discharge.  Skin:  Negative for rash and wound.  Neurological:  Negative for dizziness and headaches.  Psychiatric/Behavioral:  Negative for self-injury and suicidal ideas.       Objective:    Physical Exam Constitutional:      Appearance: She is obese.  HENT:     Head: Normocephalic.     Right Ear: External ear normal.  Left Ear: External ear normal.     Nose: No congestion.     Mouth/Throat:     Mouth: Mucous membranes are moist.  Eyes:     Extraocular Movements: Extraocular movements intact.  Cardiovascular:     Rate and Rhythm: Normal rate and regular rhythm.     Pulses: Normal pulses.     Heart sounds: Normal heart sounds.  Pulmonary:     Breath sounds: Normal breath sounds.  Abdominal:     Palpations: Abdomen is soft.  Musculoskeletal:     Cervical back: No rigidity.     Right lower leg: No edema.     Left lower leg: No edema.  Skin:    General: Skin is warm.  Neurological:     Mental Status: She is alert and oriented to person, place, and time.  Psychiatric:     Comments: Normal affect     BP 132/78   Pulse 65   Ht 5' 2.5" (1.588 m)   Wt 184 lb 6.4 oz (83.6 kg)    SpO2 99%   BMI 33.19 kg/m  Wt Readings from Last 3 Encounters:  01/19/22 184 lb 6.4 oz (83.6 kg)  10/15/21 179 lb 6.4 oz (81.4 kg)  07/31/20 158 lb 3.2 oz (71.8 kg)    Lab Results  Component Value Date   TSH 1.060 10/15/2021   Lab Results  Component Value Date   WBC 4.7 10/15/2021   HGB 14.1 10/15/2021   HCT 41.3 10/15/2021   MCV 88 10/15/2021   PLT 279 10/15/2021   Lab Results  Component Value Date   NA 141 10/15/2021   K 4.5 10/15/2021   CO2 22 10/15/2021   GLUCOSE 89 10/15/2021   BUN 9 10/15/2021   CREATININE 0.79 10/15/2021   BILITOT 0.3 10/15/2021   ALKPHOS 34 (L) 10/15/2021   AST 30 10/15/2021   ALT 27 10/15/2021   PROT 7.5 10/15/2021   ALBUMIN 4.5 10/15/2021   CALCIUM 9.7 10/15/2021   EGFR 108 10/15/2021   Lab Results  Component Value Date   CHOL 177 10/15/2021   Lab Results  Component Value Date   HDL 58 10/15/2021   Lab Results  Component Value Date   LDLCALC 106 (H) 10/15/2021   Lab Results  Component Value Date   TRIG 69 10/15/2021   Lab Results  Component Value Date   CHOLHDL 3.1 10/15/2021   Lab Results  Component Value Date   HGBA1C 5.1 10/15/2021      Assessment & Plan:   Problem List Items Addressed This Visit       Other   Obesity (Chronic)    Encouraged heart healthy diet and increasing physical activities      Relevant Orders   CBC with Differential/Platelet   CMP14+EGFR   TSH + free T4   Lipid panel   Breakthrough bleeding on birth control pills    Resolved No complaints or concerns Complaint with COC      Other Visit Diagnoses     IFG (impaired fasting glucose)    -  Primary   Relevant Orders   Hemoglobin A1C   Vitamin D deficiency       Relevant Orders   Vitamin D (25 hydroxy)       No orders of the defined types were placed in this encounter.   Follow-up: Return in about 4 months (around 05/22/2022) for CPE.    Alvira Monday, FNP

## 2022-01-21 LAB — LIPID PANEL
Chol/HDL Ratio: 3.4 ratio (ref 0.0–4.4)
Cholesterol, Total: 181 mg/dL (ref 100–199)
HDL: 54 mg/dL (ref >39)
LDL Chol Calc (NIH): 108 mg/dL — ABNORMAL HIGH (ref 0–99)
Triglycerides: 103 mg/dL (ref 0–149)
VLDL Cholesterol Cal: 19 mg/dL (ref 5–40)

## 2022-01-21 LAB — CMP14+EGFR
ALT: 14 IU/L (ref 0–32)
AST: 17 IU/L (ref 0–40)
Albumin/Globulin Ratio: 1.8 (ref 1.2–2.2)
Albumin: 4.6 g/dL (ref 4.0–5.0)
Alkaline Phosphatase: 32 IU/L — ABNORMAL LOW (ref 44–121)
BUN/Creatinine Ratio: 21 (ref 9–23)
BUN: 16 mg/dL (ref 6–20)
Bilirubin Total: 0.4 mg/dL (ref 0.0–1.2)
CO2: 24 mmol/L (ref 20–29)
Calcium: 9.9 mg/dL (ref 8.7–10.2)
Chloride: 102 mmol/L (ref 96–106)
Creatinine, Ser: 0.78 mg/dL (ref 0.57–1.00)
Globulin, Total: 2.5 g/dL (ref 1.5–4.5)
Glucose: 84 mg/dL (ref 70–99)
Potassium: 4.5 mmol/L (ref 3.5–5.2)
Sodium: 139 mmol/L (ref 134–144)
Total Protein: 7.1 g/dL (ref 6.0–8.5)
eGFR: 109 mL/min/{1.73_m2} (ref >59)

## 2022-01-21 LAB — CBC WITH DIFFERENTIAL/PLATELET
Basophils Absolute: 0 10*3/uL (ref 0.0–0.2)
Basos: 0 %
EOS (ABSOLUTE): 0.1 10*3/uL (ref 0.0–0.4)
Eos: 1 %
Hematocrit: 40.4 % (ref 34.0–46.6)
Hemoglobin: 13.3 g/dL (ref 11.1–15.9)
Immature Grans (Abs): 0 10*3/uL (ref 0.0–0.1)
Immature Granulocytes: 0 %
Lymphocytes Absolute: 2 10*3/uL (ref 0.7–3.1)
Lymphs: 40 %
MCH: 29.5 pg (ref 26.6–33.0)
MCHC: 32.9 g/dL (ref 31.5–35.7)
MCV: 90 fL (ref 79–97)
Monocytes Absolute: 0.3 10*3/uL (ref 0.1–0.9)
Monocytes: 6 %
Neutrophils Absolute: 2.6 10*3/uL (ref 1.4–7.0)
Neutrophils: 53 %
Platelets: 255 10*3/uL (ref 150–450)
RBC: 4.51 x10E6/uL (ref 3.77–5.28)
RDW: 12.7 % (ref 11.7–15.4)
WBC: 5 10*3/uL (ref 3.4–10.8)

## 2022-01-21 LAB — VITAMIN D 25 HYDROXY (VIT D DEFICIENCY, FRACTURES): Vit D, 25-Hydroxy: 32.8 ng/mL (ref 30.0–100.0)

## 2022-01-21 LAB — TSH+FREE T4
Free T4: 1.01 ng/dL (ref 0.82–1.77)
TSH: 0.591 u[IU]/mL (ref 0.450–4.500)

## 2022-01-21 LAB — HEMOGLOBIN A1C
Est. average glucose Bld gHb Est-mCnc: 100 mg/dL
Hgb A1c MFr Bld: 5.1 % (ref 4.8–5.6)

## 2022-01-21 NOTE — Progress Notes (Signed)
Please inform the patient that her vit. D levels are within normal limits.

## 2022-05-25 ENCOUNTER — Ambulatory Visit (INDEPENDENT_AMBULATORY_CARE_PROVIDER_SITE_OTHER): Payer: BC Managed Care – PPO | Admitting: Family Medicine

## 2022-05-25 ENCOUNTER — Encounter: Payer: Self-pay | Admitting: Family Medicine

## 2022-05-25 VITALS — BP 132/85 | HR 81 | Ht 62.5 in | Wt 181.1 lb

## 2022-05-25 DIAGNOSIS — Z124 Encounter for screening for malignant neoplasm of cervix: Secondary | ICD-10-CM

## 2022-05-25 DIAGNOSIS — E6609 Other obesity due to excess calories: Secondary | ICD-10-CM

## 2022-05-25 DIAGNOSIS — R7301 Impaired fasting glucose: Secondary | ICD-10-CM | POA: Diagnosis not present

## 2022-05-25 DIAGNOSIS — Z0001 Encounter for general adult medical examination with abnormal findings: Secondary | ICD-10-CM | POA: Insufficient documentation

## 2022-05-25 DIAGNOSIS — K829 Disease of gallbladder, unspecified: Secondary | ICD-10-CM | POA: Insufficient documentation

## 2022-05-25 DIAGNOSIS — E038 Other specified hypothyroidism: Secondary | ICD-10-CM

## 2022-05-25 DIAGNOSIS — E7849 Other hyperlipidemia: Secondary | ICD-10-CM

## 2022-05-25 DIAGNOSIS — Z23 Encounter for immunization: Secondary | ICD-10-CM

## 2022-05-25 DIAGNOSIS — E559 Vitamin D deficiency, unspecified: Secondary | ICD-10-CM | POA: Diagnosis not present

## 2022-05-25 DIAGNOSIS — Z6833 Body mass index (BMI) 33.0-33.9, adult: Secondary | ICD-10-CM

## 2022-05-25 LAB — POCT URINALYSIS DIP (CLINITEK)
Bilirubin, UA: NEGATIVE
Blood, UA: NEGATIVE
Glucose, UA: NEGATIVE mg/dL
Ketones, POC UA: NEGATIVE mg/dL
Leukocytes, UA: NEGATIVE
Nitrite, UA: NEGATIVE
POC PROTEIN,UA: NEGATIVE
Spec Grav, UA: 1.02 (ref 1.010–1.025)
Urobilinogen, UA: 0.2 E.U./dL
pH, UA: 5 (ref 5.0–8.0)

## 2022-05-25 NOTE — Assessment & Plan Note (Signed)
Low suspicion for cholecystitis Negative Eulah Pont' sign Negative nitrates and leukocytes on UA Will assess lipase and amylase level

## 2022-05-25 NOTE — Assessment & Plan Note (Signed)
Physical exam as documented Counseling is done on healthy lifestyle involving commitment to 150 minutes of exercise per week,  She received her flu vaccination today and would like a referral to GYN for Pap smear examination

## 2022-05-25 NOTE — Progress Notes (Signed)
Complete physical exam  Patient: [name] [name]   DOB: Nov 15, 1995   26 y.o. Female  MRN: [id number]  Subjective:    Chief Complaint  Patient presents with   Annual Exam    Cpe today, pt reports worries about her gallbladder due to family hx, has been having mild pain on her low back since 03/25/2022.     [name] [name] is a 26 y.o. female who presents today for a complete physical exam. She reports consuming a general vegetarian diet. Gym/ health club routine includes running. She generally feels fairly well. She reports sleeping well. She does have additional problems to discuss today.   Gallbladder disease: She reports maternal family history of gallbladder disease, noting that her mother had gallbladder disease at the age of 14.  She reports having left flank pain that radiates to her her back.  She denies fever and vomiting and reports having occasional nausea.  She notes that the pain is intermittent and rates the pain 6 out of 10.  No pain  reported today.  She notes that the pain sometimes radiates to her right lower back.  She complains of urgency and frequency with urination.  Most recent fall risk assessment:    05/25/2022    8:08 AM  [place] in the past year? 0  Number falls in past yr: 0  Injury with Fall? 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  Started on Junel 1.5/30 OCPs.   Vision abnormalities    wears contacts   Past Surgical History:  Procedure Laterality Date   None     Social History   Tobacco Use   Smoking status: Never   Smokeless tobacco: Never  Substance Use Topics   Alcohol use: No    Alcohol/week: 0.0 standard drinks of alcohol   Drug use: No   Social History   Socioeconomic History   Marital status: Single    Spouse name: Not on file   Number of children: Not on file   Years of education: Not on file   Highest education level: Not on file  Occupational History   Not on file  Tobacco Use   Smoking status: Never   Smokeless tobacco: Never  Substance and Sexual Activity   Alcohol use: No    Alcohol/week: 0.0 standard drinks of alcohol   Drug use: No   Sexual activity: Never  Other Topics Concern   Not on file  Social History Narrative   She is in her senior year  at [employer] in the fall for music education.  She lives her mom during the summer and in the dorms during school. She enjoys everything about music.    Social Determinants of Health   Financial Resource Strain: Not on file  Food Insecurity: Not on file  Transportation Needs: Not on file  Physical Activity: Not on file  Stress: Not on file  Social Connections: Not on file  Intimate Partner Violence: Not on file   Family Status  Relation Name Status   MGF  Deceased   MGM  Alive   Other [place] Alive   Mother  Alive   Father  Alive   PGM  Alive   PGF  Deceased   Mat Aunt  (Not Specified)   Family History  Problem Relation Age of Onset   COPD Maternal Grandfather    Asthma Maternal Grandfather    Diabetes Maternal Grandfather    Hypertension Maternal Grandfather    Diabetes Maternal Grandmother    Hypertension Maternal  Grandmother    Heart disease Other        valve issuses   Hepatitis C Father    Diabetes Maternal Aunt    No Known Allergies    Patient Care Team: Alvira Monday, FNP as PCP - General (Family Medicine) Harlen Labs, MD as Referring Physician (Optometry)   Outpatient Medications Prior to Visit  Medication Sig   norethindrone-ethinyl estradiol-iron (JUNEL FE 1.5/30) 1.5-30 MG-MCG tablet Take active pills only x 9 weeks, then 1 week of inactive pills.   No facility-administered medications prior to visit.    Review of Systems  Constitutional:  Negative for chills and fever.  HENT:  Negative for congestion and hearing loss.   Eyes:  Negative for redness.  Respiratory:  Negative for cough and wheezing.   Cardiovascular:  Negative for chest pain and palpitations.  Gastrointestinal:  Negative for abdominal pain and vomiting.  Genitourinary:  Positive for flank pain and frequency.  Musculoskeletal:  Negative for back pain and joint pain.  Skin:  Negative for itching.  Neurological:  Negative for dizziness and headaches.  Psychiatric/Behavioral:  Negative for memory loss, substance abuse and suicidal ideas.        Objective:    BP 132/85   Pulse 81   Ht 5' 2.5" (1.588 m)   Wt 181 lb 1.9 oz (82.2 kg)   LMP 04/29/2022   SpO2 98%   BMI 32.60 kg/m  BP Readings from Last 3 Encounters:  05/25/22 132/85  01/19/22 132/78  10/15/21 116/72   Wt Readings from Last 3 Encounters:  05/25/22 181 lb 1.9 oz (82.2 kg)  01/19/22 184 lb 6.4 oz (83.6 kg)  10/15/21 179 lb 6.4 oz (81.4 kg)      Physical Exam HENT:     Head: Normocephalic.     Right Ear: There is no impacted cerumen.     Left Ear: There is no impacted cerumen.     Nose: No congestion.     Mouth/Throat:     Mouth: Mucous membranes are moist.  Eyes:     Extraocular Movements: Extraocular movements intact.     Pupils: Pupils are equal, round, and reactive to light.  Cardiovascular:     Rate and Rhythm: Normal  rate and regular rhythm.     Heart sounds: No murmur heard. Pulmonary:     Effort: No respiratory distress.  Abdominal:     General: Abdomen is flat. Bowel sounds are normal.     Tenderness:  There is no abdominal tenderness. There is no right CVA tenderness or left CVA tenderness. Negative signs include Murphy's sign.     Comments: Negative Murphy's sign  Musculoskeletal:     Cervical back: No rigidity.     Right lower leg: No edema.     Left lower leg: No edema.  Skin:    Findings: No lesion or rash.  Neurological:     Mental Status: She is alert and oriented to person, place, and time.  Psychiatric:     Comments: Normal affect     Results for orders placed or performed in visit on 05/25/22  POCT URINALYSIS DIP (CLINITEK)  Result Value Ref Range   Color, UA yellow yellow   Clarity, UA clear clear   Glucose, UA negative negative mg/dL   Bilirubin, UA negative negative   Ketones, POC UA negative negative mg/dL   Spec Grav, UA 1.020 1.010 - 1.025   Blood, UA negative negative   pH, UA 5.0 5.0 - 8.0   POC PROTEIN,UA negative negative, trace   Urobilinogen, UA 0.2 0.2 or 1.0 E.U./dL   Nitrite, UA Negative Negative   Leukocytes, UA Negative Negative   Last CBC Lab Results  Component Value Date   WBC 5.0 01/20/2022   HGB 13.3 01/20/2022   HCT 40.4 01/20/2022   MCV 90 01/20/2022   MCH 29.5 01/20/2022   RDW 12.7 01/20/2022   PLT 255 42/70/6237   Last metabolic panel Lab Results  Component Value Date   GLUCOSE 84 01/20/2022   NA 139 01/20/2022   K 4.5 01/20/2022   CL 102 01/20/2022   CO2 24 01/20/2022   BUN 16 01/20/2022   CREATININE 0.78 01/20/2022   EGFR 109 01/20/2022   CALCIUM 9.9 01/20/2022   PROT 7.1 01/20/2022   ALBUMIN 4.6 01/20/2022   LABGLOB 2.5 01/20/2022   AGRATIO 1.8 01/20/2022   BILITOT 0.4 01/20/2022   ALKPHOS 32 (L) 01/20/2022   AST 17 01/20/2022   ALT 14 01/20/2022   Last lipids Lab Results  Component Value Date   CHOL 181 01/20/2022    HDL 54 01/20/2022   LDLCALC 108 (H) 01/20/2022   TRIG 103 01/20/2022   CHOLHDL 3.4 01/20/2022   Last hemoglobin A1c Lab Results  Component Value Date   HGBA1C 5.1 01/20/2022   Last thyroid functions Lab Results  Component Value Date   TSH 0.591 01/20/2022   Last vitamin D Lab Results  Component Value Date   VD25OH 32.8 01/20/2022   Last vitamin B12 and Folate No results found for: "VITAMINB12", "FOLATE"      Assessment & Plan:    Routine Health Maintenance and Physical Exam  Immunization History  Administered Date(s) Administered   HPV 9-valent 10/15/2021, 05/25/2022   Influenza,inj,Quad PF,6+ Mos 05/25/2022   Janssen (J&J) SARS-COV-2 Vaccination 07/31/2019   Tdap 10/15/2021    Health Maintenance  Topic Date Due   PAP-Cervical Cytology Screening  Never done   PAP SMEAR-Modifier  Never done   COVID-19 Vaccine (2 - 2023-24 season) 02/27/2022   HPV VACCINES (3 - 3-dose series) 09/23/2022   INFLUENZA VACCINE  Completed   Hepatitis C Screening  Completed   HIV Screening  Completed    Discussed health benefits of physical activity, and encouraged her to engage in regular exercise appropriate for her age and condition.  Encounter for general adult medical examination with abnormal findings Assessment & Plan: Physical exam as documented Counseling is done on healthy lifestyle involving commitment to 150 minutes  of exercise per week,  She received her flu vaccination today and would like a referral to GYN for Pap smear examination      Gallbladder disease Assessment & Plan: Low suspicion for cholecystitis Negative Percell Miller' sign Negative nitrates and leukocytes on UA Will assess lipase and amylase level   Orders: -     Amylase -     Lipase -     POCT URINALYSIS DIP (CLINITEK)  Need for immunization against influenza  Flu vaccine need -     Flu Vaccine QUAD 59moIM (Fluarix, Fluzone & Alfiuria Quad PF)  Immunization due -     HPV 9-valent  vaccine,Recombinat  Vitamin D deficiency -     VITAMIN D 25 Hydroxy (Vit-D Deficiency, Fractures)  Cervical cancer screening -     Ambulatory referral to Obstetrics / Gynecology  IFG (impaired fasting glucose) -     Hemoglobin A1c  Other specified hypothyroidism -     TSH + free T4  Class 1 obesity due to excess calories without serious comorbidity with body mass index (BMI) of 33.0 to 33.9 in adult  Other hyperlipidemia -     Lipid panel -     CMP14+EGFR -     CBC with Differential/Platelet    Return in about 4 months (around 09/23/2022).     GAlvira Monday FNP

## 2022-05-25 NOTE — Patient Instructions (Signed)
I appreciate the opportunity to provide care to you today!    Follow up:  4 months  Labs: please stop by the lab today to get your blood drawn (CBC, CMP, TSH, Lipid profile, HgA1c, Vit D)  Screening: lipase and amylase  Thank you for getting your Flu vaccine   Please continue to a heart-healthy diet and increase your physical activities. Try to exercise for at least three times a week.      It was a pleasure to see you and I look forward to continuing to work together on your health and well-being. Please do not hesitate to call the office if you need care or have questions about your care.   Have a wonderful day and week. With Gratitude, Gilmore Laroche MSN, FNP-BC

## 2022-05-26 LAB — CMP14+EGFR
ALT: 12 IU/L (ref 0–32)
AST: 13 IU/L (ref 0–40)
Albumin/Globulin Ratio: 1.6 (ref 1.2–2.2)
Albumin: 4.7 g/dL (ref 4.0–5.0)
Alkaline Phosphatase: 33 IU/L — ABNORMAL LOW (ref 44–121)
BUN/Creatinine Ratio: 17 (ref 9–23)
BUN: 14 mg/dL (ref 6–20)
Bilirubin Total: <0.2 mg/dL (ref 0.0–1.2)
CO2: 20 mmol/L (ref 20–29)
Calcium: 9.6 mg/dL (ref 8.7–10.2)
Chloride: 103 mmol/L (ref 96–106)
Creatinine, Ser: 0.82 mg/dL (ref 0.57–1.00)
Globulin, Total: 2.9 g/dL (ref 1.5–4.5)
Glucose: 91 mg/dL (ref 70–99)
Potassium: 4.4 mmol/L (ref 3.5–5.2)
Sodium: 138 mmol/L (ref 134–144)
Total Protein: 7.6 g/dL (ref 6.0–8.5)
eGFR: 102 mL/min/{1.73_m2} (ref >59)

## 2022-05-26 LAB — CBC WITH DIFFERENTIAL/PLATELET
Basophils Absolute: 0 10*3/uL (ref 0.0–0.2)
Basos: 1 %
EOS (ABSOLUTE): 0.1 10*3/uL (ref 0.0–0.4)
Eos: 1 %
Hematocrit: 41.1 % (ref 34.0–46.6)
Hemoglobin: 14.1 g/dL (ref 11.1–15.9)
Immature Grans (Abs): 0 10*3/uL (ref 0.0–0.1)
Immature Granulocytes: 0 %
Lymphocytes Absolute: 2.2 10*3/uL (ref 0.7–3.1)
Lymphs: 35 %
MCH: 30.5 pg (ref 26.6–33.0)
MCHC: 34.3 g/dL (ref 31.5–35.7)
MCV: 89 fL (ref 79–97)
Monocytes Absolute: 0.3 10*3/uL (ref 0.1–0.9)
Monocytes: 5 %
Neutrophils Absolute: 3.6 10*3/uL (ref 1.4–7.0)
Neutrophils: 58 %
Platelets: 269 10*3/uL (ref 150–450)
RBC: 4.63 x10E6/uL (ref 3.77–5.28)
RDW: 12.5 % (ref 11.7–15.4)
WBC: 6.2 10*3/uL (ref 3.4–10.8)

## 2022-05-26 LAB — TSH+FREE T4
Free T4: 1.12 ng/dL (ref 0.82–1.77)
TSH: 1.67 u[IU]/mL (ref 0.450–4.500)

## 2022-05-26 LAB — LIPID PANEL
Chol/HDL Ratio: 2.6 ratio (ref 0.0–4.4)
Cholesterol, Total: 178 mg/dL (ref 100–199)
HDL: 69 mg/dL (ref >39)
LDL Chol Calc (NIH): 96 mg/dL (ref 0–99)
Triglycerides: 66 mg/dL (ref 0–149)
VLDL Cholesterol Cal: 13 mg/dL (ref 5–40)

## 2022-05-26 LAB — VITAMIN D 25 HYDROXY (VIT D DEFICIENCY, FRACTURES): Vit D, 25-Hydroxy: 22.7 ng/mL — ABNORMAL LOW (ref 30.0–100.0)

## 2022-05-26 LAB — HEMOGLOBIN A1C
Est. average glucose Bld gHb Est-mCnc: 103 mg/dL
Hgb A1c MFr Bld: 5.2 % (ref 4.8–5.6)

## 2022-05-26 LAB — AMYLASE: Amylase: 86 U/L (ref 31–110)

## 2022-05-27 ENCOUNTER — Encounter: Payer: BC Managed Care – PPO | Admitting: Obstetrics & Gynecology

## 2022-06-24 ENCOUNTER — Encounter: Payer: Self-pay | Admitting: Obstetrics & Gynecology

## 2022-06-24 ENCOUNTER — Other Ambulatory Visit (HOSPITAL_COMMUNITY)
Admission: RE | Admit: 2022-06-24 | Discharge: 2022-06-24 | Disposition: A | Discharge status: Home / Self Care | Payer: BC Managed Care – PPO | Source: Ambulatory Visit | Attending: Obstetrics & Gynecology | Admitting: Obstetrics & Gynecology

## 2022-06-24 ENCOUNTER — Ambulatory Visit: Payer: BC Managed Care – PPO | Admitting: Obstetrics & Gynecology

## 2022-06-24 VITALS — BP 127/81 | HR 69 | Ht 62.5 in | Wt 180.0 lb

## 2022-06-24 DIAGNOSIS — N915 Oligomenorrhea, unspecified: Secondary | ICD-10-CM | POA: Diagnosis not present

## 2022-06-24 DIAGNOSIS — Z113 Encounter for screening for infections with a predominantly sexual mode of transmission: Secondary | ICD-10-CM | POA: Diagnosis present

## 2022-06-24 DIAGNOSIS — Z01419 Encounter for gynecological examination (general) (routine) without abnormal findings: Secondary | ICD-10-CM | POA: Diagnosis not present

## 2022-06-24 DIAGNOSIS — Z3041 Encounter for surveillance of contraceptive pills: Secondary | ICD-10-CM | POA: Diagnosis not present

## 2022-06-24 MED ORDER — NORETHIN ACE-ETH ESTRAD-FE 1.5-30 MG-MCG PO TABS: ORAL_TABLET | ORAL | 4 refills | Status: DC

## 2022-06-24 NOTE — Progress Notes (Signed)
WELL-WOMAN EXAMINATION Patient name: [name] [name] MRN [id number]  Date of birth: 05-16-1996 Chief Complaint:   Gynecologic Exam (Completed annual exam with her PCP on 05/25/22. Visit in [place]. /Just needs Pap smear)  History of Present Illness:   [name] [name] is a 26 y.o. G0P0000 female being seen today for a routine well-woman exam.  Today she notes no acute compaints or concerns   Patient's last menstrual period was 04/29/2022. Denies issues with her menses The current method of family planning is OCP (estrogen/progesterone).  Cycles menses every 3 mos- occasional intermenstrual bleeding.   Last pap to be completed today.  Last mammogram: n/a. Last colonoscopy: n/a     06/24/2022    9:05 AM 05/25/2022    8:08 AM 01/19/2022    8:26 AM 10/15/2021    8:12 AM  Depression screen PHQ 2/9  Decreased Interest 0 0 0 0  Down, Depressed, Hopeless 0 0 0 0  PHQ - 2 Score 0 0 0 0  Altered sleeping 1 0    Tired, decreased energy 1 0    Change in appetite 1 0    Feeling bad or failure about yourself  0 0    Trouble concentrating 0 0    Moving slowly or fidgety/restless 0 0    Suicidal thoughts 0 0    PHQ-9 Score 3 0    Difficult doing work/chores  Not difficult at all        Review of Systems:   Pertinent items are noted in HPI Denies any headaches, blurred vision, fatigue, shortness of breath, chest pain, abdominal pain, bowel movements, urination, or intercourse unless otherwise stated above.  Pertinent History Reviewed:  Reviewed past medical,surgical, social and family history.  Reviewed problem list, medications and allergies. Physical Assessment:   Vitals:   06/24/22 0904  BP: 127/81  Pulse: 69  Weight: 180 lb (81.6 kg)  Height: 5' 2.5" (1.588 m)  Body mass index is 32.4 kg/m.        Physical Examination:   General appearance - well appearing, and in no distress  Mental status - alert, oriented to person, place, and time  Psych:  She has a normal mood and  affect  Skin - warm and dry, normal color, no suspicious lesions noted  Chest - effort normal, all lung fields clear to auscultation bilaterally  Heart - normal rate and regular rhythm  Neck:  midline trachea, no thyromegaly or nodules  Breasts - breasts appear normal, no suspicious masses, no skin or nipple changes or  axillary nodes  Abdomen - soft, nontender, nondistended, no masses or organomegaly  Pelvic - VULVA: normal appearing vulva with no masses, tenderness or lesions  VAGINA: normal appearing vagina with normal color and discharge, no lesions  CERVIX: normal appearing cervix without discharge or lesions, no CMT  Thin prep pap is done with HR HPV cotesting  UTERUS: uterus is felt to be normal size, shape, consistency and nontender   ADNEXA: No adnexal masses or tenderness noted.  Extremities:  No swelling or varicosities noted  Chaperone:  [name] [name]      Assessment & Plan:  1) Well-Woman Exam -pap collected, reviewed ASCCP guidelines -STI screening to be collected  2) Contraceptive management -doing well with OCPs, plan to conitnue  Orders Placed This Encounter  Procedures   RPR   HIV Antibody (routine testing w rflx)    Meds:  Meds ordered this encounter  Medications   norethindrone-ethinyl estradiol-iron (JUNEL FE 1.5/30) 1.5-30 MG-MCG  tablet    Sig: Take active pills only x 9 weeks, then 1 week of inactive pills.    Dispense:  84 tablet    Refill:  4    Follow-up: Return in about 1 year (around 06/25/2023) for Annual.   Myna Hidalgo, DO Attending Obstetrician & Gynecologist, [facility], [facility]

## 2022-06-24 NOTE — Addendum Note (Signed)
Addended by: Dereck Ligas on: 06/24/2022 09:37 AM   Modules accepted: Orders

## 2022-06-25 LAB — HIV ANTIBODY (ROUTINE TESTING W REFLEX): HIV Screen 4th Generation wRfx: NONREACTIVE

## 2022-06-25 LAB — CYTOLOGY - PAP
Adequacy: ABSENT
Chlamydia: NEGATIVE
Comment: NEGATIVE
Comment: NORMAL
Diagnosis: NEGATIVE
Neisseria Gonorrhea: NEGATIVE

## 2022-06-25 LAB — RPR: RPR Ser Ql: NONREACTIVE

## 2022-09-28 ENCOUNTER — Ambulatory Visit: Payer: BC Managed Care – PPO | Admitting: Family Medicine

## 2023-01-19 ENCOUNTER — Other Ambulatory Visit: Payer: Self-pay | Admitting: Family Medicine

## 2023-01-19 DIAGNOSIS — N915 Oligomenorrhea, unspecified: Secondary | ICD-10-CM

## 2023-07-14 ENCOUNTER — Encounter (INDEPENDENT_AMBULATORY_CARE_PROVIDER_SITE_OTHER): Payer: Self-pay

## 2023-11-25 ENCOUNTER — Telehealth: Payer: Self-pay | Admitting: Family Medicine

## 2023-11-25 ENCOUNTER — Encounter: Payer: Self-pay | Admitting: Family Medicine

## 2023-11-25 ENCOUNTER — Ambulatory Visit: Payer: Self-pay | Admitting: Family Medicine

## 2023-11-25 VITALS — BP 123/83 | HR 74 | Resp 16 | Ht 62.5 in | Wt 176.1 lb

## 2023-11-25 DIAGNOSIS — E6609 Other obesity due to excess calories: Secondary | ICD-10-CM | POA: Diagnosis not present

## 2023-11-25 DIAGNOSIS — L700 Acne vulgaris: Secondary | ICD-10-CM | POA: Diagnosis not present

## 2023-11-25 DIAGNOSIS — N915 Oligomenorrhea, unspecified: Secondary | ICD-10-CM | POA: Diagnosis not present

## 2023-11-25 MED ORDER — DROSPIRENONE-ETHINYL ESTRADIOL 3-0.02 MG PO TABS: 1.0000 | ORAL_TABLET | Freq: Every day | ORAL | 11 refills | Status: DC

## 2023-11-25 MED ORDER — CLINDAMYCIN PHOS-BENZOYL PEROX 1-5 % EX GEL
Freq: Every day | CUTANEOUS | 0 refills | Status: AC
Start: 2023-11-25 — End: ?

## 2023-11-25 NOTE — Progress Notes (Signed)
 Established Patient Office Visit  Subjective:  Patient ID: [name] [name], female    DOB: [birth year]  Age: 28 y.o. MRN: [id number]  CC:  Chief Complaint  Patient presents with   Contraception    Issues with birth control. Since Nov she has been having a lot of acne and she has cut back on sugar. Wants to discuss other options for birth control     HPI [name] [name] is a 28 y.o. female  presents for follow-up regarding contraception, with a past medical history of oligomenorrhea. She reports increased acne over the past two months while on her current oral contraceptive. Although she is now having regular monthly menstrual cycles and is currently on her cycle, she expresses concern about her inability to lose weight and identifies increased acne as her primary complaint today.  Past Medical History:  Diagnosis Date   Anxiety    Heart murmur    as a baby,    Obesity    Oligomenorrhea    In 09/2015 had elevated free testosterone and low SHBG.  Started on Junel 1.5/30 OCPs.   Vision abnormalities    wears contacts    Past Surgical History:  Procedure Laterality Date   None      Family History  Problem Relation Age of Onset   COPD Maternal Grandfather    Asthma Maternal Grandfather    Diabetes Maternal Grandfather    Hypertension Maternal Grandfather    Diabetes Maternal Grandmother    Hypertension Maternal Grandmother    Heart disease Other        valve issuses   Hepatitis C Father    Diabetes Maternal Aunt     Social History   Socioeconomic History   Marital status: Single    Spouse name: Not on file   Number of children: Not on file   Years of education: Not on file   Highest education level: Not on file  Occupational History   Not on file  Tobacco Use   Smoking status: Never   Smokeless tobacco: Never  Vaping Use   Vaping status: Never Used  Substance and Sexual Activity   Alcohol use: No    Alcohol/week: 0.0 standard drinks of alcohol   Drug use:  No   Sexual activity: Not Currently    Birth control/protection: Pill  Other Topics Concern   Not on file  Social History Narrative   She 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  She lives her mom during the summer and in the dorms during school. She enjoys everything about music.    Social Drivers of Corporate investment banker Strain: Low Risk  (06/24/2022)   Overall Financial Resource Strain (CARDIA)    Difficulty of Paying Living Expenses: Not hard at all  Food Insecurity: No Food Insecurity (06/24/2022)   Hunger Vital Sign    Worried About Running Out of Food in the Last Year: Never true    Ran Out of Food in the Last Year: Never true  Transportation Needs: No Transportation Needs (06/24/2022)   PRAPARE - Administrator, Civil Service (Medical): No    Lack of Transportation (Non-Medical): No  Physical Activity: Sufficiently Active (06/24/2022)   Exercise Vital Sign    Days of Exercise per Week: 3 days    Minutes of Exercise per Session: 50 min  Stress: Stress Concern Present (06/24/2022)   [employer] of Occupational Health - Occupational Stress Questionnaire  Feeling of Stress : To some extent  Social Connections: Socially Isolated (06/24/2022)   Social Connection and Isolation Panel [NHANES]    Frequency of Communication with Friends and Family: Three times a week    Frequency of Social Gatherings with Friends and Family: Once a week    Attends Religious Services: Never    Database administrator or Organizations: No    Attends Banker Meetings: Never    Marital Status: Never married  Intimate Partner Violence: Not At Risk (06/24/2022)   Humiliation, Afraid, Rape, and Kick questionnaire    Fear of Current or Ex-Partner: No    Emotionally Abused: No    Physically Abused: No    Sexually Abused: No    Outpatient Medications Prior to Visit  Medication Sig Dispense Refill   norethindrone-ethinyl estradiol -iron  (JUNEL FE 1.5/30) 1.5-30 MG-MCG tablet TAKE ACTIVE PILLS ONLY X 9 WEEKS, THEN 1 WEEK OF INACTIVE PILLS. 84 tablet 4   No facility-administered medications prior to visit.    No Known Allergies  ROS Review of Systems  Constitutional:  Negative for chills and fever.  Eyes:  Negative for visual disturbance.  Respiratory:  Negative for chest tightness and shortness of breath.   Neurological:  Negative for dizziness and headaches.      Objective:     Physical Exam HENT:     Head: Normocephalic.     Mouth/Throat:     Mouth: Mucous membranes are moist.  Cardiovascular:     Rate and Rhythm: Normal rate.     Heart sounds: Normal heart sounds.  Pulmonary:     Effort: Pulmonary effort is normal.     Breath sounds: Normal breath sounds.  Neurological:     Mental Status: She is alert.     BP 123/83   Pulse 74   Resp 16   Ht 5' 2.5" (1.588 m)   Wt 176 lb 1.9 oz (79.9 kg)   SpO2 98%   BMI 31.70 kg/m  Wt Readings from Last 3 Encounters:  11/25/23 176 lb 1.9 oz (79.9 kg)  06/24/22 180 lb (81.6 kg)  05/25/22 181 lb 1.9 oz (82.2 kg)    Lab Results  Component Value Date   TSH 1.670 05/25/2022   Lab Results  Component Value Date   WBC 6.2 05/25/2022   HGB 14.1 05/25/2022   HCT 41.1 05/25/2022   MCV 89 05/25/2022   PLT 269 05/25/2022   Lab Results  Component Value Date   NA 138 05/25/2022   K 4.4 05/25/2022   CO2 20 05/25/2022   GLUCOSE 91 05/25/2022   BUN 14 05/25/2022   CREATININE 0.82 05/25/2022   BILITOT <0.2 05/25/2022   ALKPHOS 33 (L) 05/25/2022   AST 13 05/25/2022   ALT 12 05/25/2022   PROT 7.6 05/25/2022   ALBUMIN 4.7 05/25/2022   CALCIUM 9.6 05/25/2022   EGFR 102 05/25/2022   Lab Results  Component Value Date   CHOL 178 05/25/2022   Lab Results  Component Value Date   HDL 69 05/25/2022   Lab Results  Component Value Date   LDLCALC 96 05/25/2022   Lab Results  Component Value Date   TRIG 66 05/25/2022   Lab Results  Component Value Date    CHOLHDL 2.6 05/25/2022   Lab Results  Component Value Date   HGBA1C 5.2 05/25/2022      Assessment & Plan:  Oligomenorrhea, unspecified type Assessment & Plan: Encouraged the patient to discontinue her current birth control  and start taking YAZ, the newly prescribed birth control pill to help manage her acne. Encourage the patient to take one tablet at the same time every day, ideally with food to reduce nausea. She may begin on the first day of her period (Day 1 Start) or on the first Sunday after her period begins (Sunday Start).   Orders: -     Drospirenone-Ethinyl Estradiol ; Take 1 tablet by mouth daily.  Dispense: 28 tablet; Refill: 11  Acne vulgaris Assessment & Plan: A prescription for BenzaClin gel (a combination of benzoyl peroxide 5% and clindamycin 1%), a topical acne treatment used to reduce inflammation and bacteria on the skin, has been sent to her pharmacy.   Orders: -     Clindamycin Phos-Benzoyl Perox; Apply topically daily.  Dispense: 25 g; Refill: 0  Obesity due to excess calories without serious comorbidity, unspecified class Assessment & Plan: For optimal results with weight loss, I recommend:  Decreasing portion sizes. Reducing sugar, sodium, and carbohydrate intake, and limiting saturated fats in your diet. Increasing your fiber intake by incorporating more whole grains, fruits, and vegetables. Setting healthy goals and focusing on lowering carbs, sugar, and fat. Increasing the variety of fruits and vegetables in your diet. Reducing soda consumption and limiting processed foods. In addition to taking your weight loss medication, engage in moderate-intensity physical activity for at least 150 minutes per week for the best results.    Note: This chart has been completed using Engineer, civil (consulting) software, and while attempts have been made to ensure accuracy, certain words and phrases may not be transcribed as intended.    Follow-up: Return in about  1 month (around 12/26/2023).   Cornell Gaber, FNP

## 2023-11-25 NOTE — Patient Instructions (Addendum)
 I appreciate the opportunity to provide care to you today!    Follow up:  1 month  BenzaClin gel (a combination of benzoyl peroxide 5% and clindamycin 1%) is a topical acne treatment used to reduce inflammation and bacteria on the skin.   When to Apply BenzaClin: Once daily, usually in the evening  How to Apply BenzaClin: Wash your face with a gentle cleanser and pat dry completely. Apply a thin layer of BenzaClin to the affected areas (typically the entire area, not just individual pimples). Avoid eyes, mouth, nostrils, and mucous membranes. Wash hands after application. Follow with a non-comedogenic moisturizer, especially if dryness or irritation occurs.  Important Tips: Avoid using other acne products (especially those containing retinoids or salicylic acid) unless directed by a provider, as this may increase irritation.  May cause dryness, redness, or peeling in the first few weeks--this usually improves with continued use.  Use sunscreen daily, as benzoyl peroxide can increase sun sensitivity.  Avoid bleaching fabrics--benzoyl peroxide can discolor towels and pillowcas      Please continue to a heart-healthy diet and increase your physical activities. Try to exercise for at least five days a week.    It was a pleasure to see you and I look forward to continuing to work together on your health and well-being. Please do not hesitate to call the office if you need care or have questions about your care.  In case of emergency, please visit the Emergency Department for urgent care, or contact our clinic at [phone] to schedule an appointment. We're here to help you!   Have a wonderful day and week. With Gratitude, Maye Parkinson MSN, FNP-BC

## 2023-11-25 NOTE — Assessment & Plan Note (Addendum)
 A prescription for BenzaClin gel (a combination of benzoyl peroxide 5% and clindamycin  1%), a topical acne treatment used to reduce inflammation and bacteria on the skin, has been sent to her pharmacy.

## 2023-11-25 NOTE — Assessment & Plan Note (Signed)
 Encouraged the patient to discontinue her current birth control and start taking YAZ, the newly prescribed birth control pill to help manage her acne. Encourage the patient to take one tablet at the same time every day, ideally with food to reduce nausea. She may begin on the first day of her period (Day 1 Start) or on the first Sunday after her period begins (Sunday Start).

## 2023-11-25 NOTE — Assessment & Plan Note (Signed)
 For optimal results with weight loss, I recommend: Decreasing portion sizes. Reducing sugar, sodium, and carbohydrate intake, and limiting saturated fats in your diet. Increasing your fiber intake by incorporating more whole grains, fruits, and vegetables. Setting healthy goals and focusing on lowering carbs, sugar, and fat. Increasing the variety of fruits and vegetables in your diet. Reducing soda consumption and limiting processed foods. In addition to taking your weight loss medication, engage in moderate-intensity physical activity for at least 150 minutes per week for the best results.

## 2023-11-25 NOTE — Telephone Encounter (Signed)
My chartt message sent to patient

## 2024-01-13 ENCOUNTER — Ambulatory Visit: Admitting: Family Medicine

## 2024-03-20 ENCOUNTER — Ambulatory Visit: Admitting: Family Medicine

## 2024-05-30 ENCOUNTER — Other Ambulatory Visit (HOSPITAL_COMMUNITY)
Admission: RE | Admit: 2024-05-30 | Discharge: 2024-05-30 | Disposition: A | Discharge status: Home / Self Care | Source: Ambulatory Visit | Attending: Obstetrics & Gynecology | Admitting: Obstetrics & Gynecology

## 2024-05-30 ENCOUNTER — Ambulatory Visit: Admitting: Obstetrics & Gynecology

## 2024-05-30 ENCOUNTER — Encounter: Payer: Self-pay | Admitting: Obstetrics & Gynecology

## 2024-05-30 VITALS — BP 125/84 | HR 86 | Ht 62.5 in | Wt 182.6 lb

## 2024-05-30 DIAGNOSIS — Z113 Encounter for screening for infections with a predominantly sexual mode of transmission: Secondary | ICD-10-CM | POA: Insufficient documentation

## 2024-05-30 DIAGNOSIS — R350 Frequency of micturition: Secondary | ICD-10-CM

## 2024-05-30 DIAGNOSIS — Z3041 Encounter for surveillance of contraceptive pills: Secondary | ICD-10-CM

## 2024-05-30 DIAGNOSIS — Z01419 Encounter for gynecological examination (general) (routine) without abnormal findings: Secondary | ICD-10-CM

## 2024-05-30 DIAGNOSIS — N915 Oligomenorrhea, unspecified: Secondary | ICD-10-CM

## 2024-05-30 DIAGNOSIS — Z131 Encounter for screening for diabetes mellitus: Secondary | ICD-10-CM | POA: Diagnosis not present

## 2024-05-30 MED ORDER — DROSPIRENONE-ETHINYL ESTRADIOL 3-0.02 MG PO TABS: 1.0000 | ORAL_TABLET | Freq: Every day | ORAL | 4 refills | Status: AC

## 2024-05-30 NOTE — Addendum Note (Signed)
 Addended by: BERNARDO ALAN CROME on: 05/30/2024 10:24 AM   Modules accepted: Orders

## 2024-05-30 NOTE — Progress Notes (Signed)
 WELL-WOMAN EXAMINATION Patient name: [name] [name] MRN [id number]  Date of birth: 1996/01/17 Chief Complaint:   Gynecologic Exam  History of Present Illness:   [place] is a 28 y.o. G0P0000 female being seen today for a routine well-woman exam and the following:  - Contraceptive management: She has been on Yaz for the past 6 months.  She does feel like it has helped with her skin, but not as good as she would like.  She still will have an outbreak leading up to her menses.  Menses will typically last for about 6 days- menses started to getting super light.  She did have some irregular spotting about 2 months ago.  Some cramping-does not take OTC medication.  Urinary frequency: Feels as though she notes urgency and frequency-sometimes at night.  She does note on occasion drinking Diet Coke in the evening.  Denies hematuria or dysuria.  Denies vaginal discharge, itching or irritation.  Denies pelvic or abdominal pain.  Denies dyspareunia.    Patient's last menstrual period was 05/08/2024. Denies issues with her menses The current method of family planning is OCP (estrogen/progesterone).    Last pap 2024.  Last mammogram: NA. Last colonoscopy: NA     05/30/2024    8:36 AM 11/25/2023    9:03 AM 06/24/2022    9:05 AM 05/25/2022    8:08 AM 01/19/2022    8:26 AM  Depression screen PHQ 2/9  Decreased Interest 0 0 0 0 0  Down, Depressed, Hopeless 0 0 0 0 0  PHQ - 2 Score 0 0 0 0 0  Altered sleeping 0 1 1 0   Tired, decreased energy 0 1 1 0   Change in appetite 0 1 1 0   Feeling bad or failure about yourself  0 0 0 0   Trouble concentrating 0 0 0 0   Moving slowly or fidgety/restless 0 0 0 0   Suicidal thoughts 0 0 0 0   PHQ-9 Score 0 3  3  0    Difficult doing work/chores  Not difficult at all  Not difficult at all      Data saved with a previous flowsheet row definition      Review of Systems:   Pertinent items are noted in HPI Denies any headaches, blurred  vision, fatigue, shortness of breath, chest pain, abdominal pain, bowel movements unless otherwise stated above.  Pertinent History Reviewed:  Reviewed past medical,surgical, social and family history.  Reviewed problem list, medications and allergies. Physical Assessment:   Vitals:   05/30/24 0837  BP: 125/84  Pulse: 86  Weight: 182 lb 9.6 oz (82.8 kg)  Height: 5' 2.5 (1.588 m)  Body mass index is 32.87 kg/m.        Physical Examination:   General appearance - well appearing, and in no distress  Mental status - alert, oriented to person, place, and time  Psych:  She has a normal mood and affect  Skin - warm and dry, normal color, no suspicious lesions noted  Chest - effort normal, all lung fields clear to auscultation bilaterally  Heart - normal rate and regular rhythm  Neck:  midline trachea, no thyromegaly or nodules  Breasts - breasts appear normal, no suspicious masses, no skin or nipple changes or  axillary nodes  Abdomen - soft, nontender, nondistended, no masses or organomegaly  Pelvic - VULVA: normal appearing vulva with no masses, tenderness or lesions  VAGINA: normal appearing vagina with normal color and discharge, no  lesions  CERVIX: normal appearing cervix without discharge or lesions, no CMT  UTERUS: uterus is felt to be normal size, shape, consistency and nontender   ADNEXA: No adnexal masses or tenderness noted.  Extremities:  No swelling or varicosities noted  Chaperone: Alan Fischer     Assessment & Plan:  1) Well-Woman Exam -pap up to date -STI screening to be completed  2) Contraceptive management -based on concerns, recommendation to continue with current COC  3) urinary frequency -advised cutting back on caffeine before bedtime -will screen for DM -if no improvement, RTC  Orders Placed This Encounter  Procedures   RPR W/RFLX TO RPR TITER, TREPONEMAL AB, SCREEN AND DIAGNOSIS   HIV Antibody (routine testing w rflx)   HgB A1c    Meds:   Meds ordered this encounter  Medications   drospirenone -ethinyl estradiol  (YAZ) 3-0.02 MG tablet    Sig: Take 1 tablet by mouth daily.    Dispense:  90 tablet    Refill:  4    Follow-up: Return in about 1 year (around 05/30/2025) for Annual.   Jamoni Hewes, DO Attending Obstetrician & Gynecologist, [facility], [facility]

## 2024-05-31 ENCOUNTER — Ambulatory Visit: Payer: Self-pay | Admitting: Obstetrics & Gynecology

## 2024-05-31 LAB — HEMOGLOBIN A1C
Est. average glucose Bld gHb Est-mCnc: 105 mg/dL
Hgb A1c MFr Bld: 5.3 % (ref 4.8–5.6)

## 2024-05-31 LAB — HIV ANTIBODY (ROUTINE TESTING W REFLEX): HIV Screen 4th Generation wRfx: NONREACTIVE

## 2024-05-31 LAB — CERVICOVAGINAL ANCILLARY ONLY
Chlamydia: NEGATIVE
Comment: NEGATIVE
Comment: NORMAL
Neisseria Gonorrhea: NEGATIVE

## 2024-05-31 LAB — SYPHILIS: RPR W/REFLEX TO RPR TITER AND TREPONEMAL ANTIBODIES, TRADITIONAL SCREENING AND DIAGNOSIS ALGORITHM: RPR Ser Ql: NONREACTIVE

## 2024-07-24 ENCOUNTER — Ambulatory Visit: Admitting: Family Medicine

## 2024-07-28 ENCOUNTER — Ambulatory Visit: Admitting: Family Medicine
